# Patient Record
Sex: Female | Born: 1970 | Race: White | Hispanic: No | Marital: Married | Smoking: Former smoker
Health system: Southern US, Community
[De-identification: ages and names within clinical notes are randomized; demographics above are authoritative.]

## PROBLEM LIST (undated history)

## (undated) DIAGNOSIS — J45909 Unspecified asthma, uncomplicated: Secondary | ICD-10-CM

## (undated) DIAGNOSIS — R51 Headache: Secondary | ICD-10-CM

## (undated) DIAGNOSIS — K219 Gastro-esophageal reflux disease without esophagitis: Secondary | ICD-10-CM

## (undated) DIAGNOSIS — F419 Anxiety disorder, unspecified: Secondary | ICD-10-CM

## (undated) DIAGNOSIS — G5601 Carpal tunnel syndrome, right upper limb: Secondary | ICD-10-CM

## (undated) DIAGNOSIS — Z9889 Other specified postprocedural states: Secondary | ICD-10-CM

## (undated) DIAGNOSIS — R112 Nausea with vomiting, unspecified: Secondary | ICD-10-CM

## (undated) HISTORY — PX: BREAST SURGERY: SHX581

## (undated) HISTORY — PX: APPENDECTOMY: SHX54

## (undated) HISTORY — PX: BREAST LUMPECTOMY: SHX2

## (undated) HISTORY — PX: TONSILLECTOMY: SUR1361

---

## 2013-07-02 ENCOUNTER — Other Ambulatory Visit: Payer: Self-pay | Admitting: Orthopedic Surgery

## 2013-07-27 ENCOUNTER — Encounter (HOSPITAL_BASED_OUTPATIENT_CLINIC_OR_DEPARTMENT_OTHER): Payer: Self-pay | Admitting: *Deleted

## 2013-07-29 ENCOUNTER — Encounter (HOSPITAL_BASED_OUTPATIENT_CLINIC_OR_DEPARTMENT_OTHER): Payer: Self-pay | Admitting: *Deleted

## 2013-08-02 ENCOUNTER — Ambulatory Visit (HOSPITAL_BASED_OUTPATIENT_CLINIC_OR_DEPARTMENT_OTHER)
Admission: RE | Admit: 2013-08-02 | Discharge: 2013-08-02 | Disposition: A | Discharge status: Home / Self Care | Payer: 59 | Source: Ambulatory Visit | Attending: Orthopedic Surgery | Admitting: Orthopedic Surgery

## 2013-08-02 ENCOUNTER — Encounter (HOSPITAL_BASED_OUTPATIENT_CLINIC_OR_DEPARTMENT_OTHER)
Admission: RE | Disposition: A | Discharge status: Home / Self Care | Payer: Self-pay | Source: Ambulatory Visit | Attending: Orthopedic Surgery

## 2013-08-02 ENCOUNTER — Encounter (HOSPITAL_BASED_OUTPATIENT_CLINIC_OR_DEPARTMENT_OTHER): Payer: 59 | Admitting: Anesthesiology

## 2013-08-02 ENCOUNTER — Ambulatory Visit (HOSPITAL_BASED_OUTPATIENT_CLINIC_OR_DEPARTMENT_OTHER): Payer: 59 | Admitting: Anesthesiology

## 2013-08-02 ENCOUNTER — Encounter (HOSPITAL_BASED_OUTPATIENT_CLINIC_OR_DEPARTMENT_OTHER): Payer: Self-pay | Admitting: *Deleted

## 2013-08-02 DIAGNOSIS — J45909 Unspecified asthma, uncomplicated: Secondary | ICD-10-CM | POA: Insufficient documentation

## 2013-08-02 DIAGNOSIS — E119 Type 2 diabetes mellitus without complications: Secondary | ICD-10-CM | POA: Insufficient documentation

## 2013-08-02 DIAGNOSIS — M129 Arthropathy, unspecified: Secondary | ICD-10-CM | POA: Insufficient documentation

## 2013-08-02 DIAGNOSIS — K219 Gastro-esophageal reflux disease without esophagitis: Secondary | ICD-10-CM | POA: Insufficient documentation

## 2013-08-02 DIAGNOSIS — Z87891 Personal history of nicotine dependence: Secondary | ICD-10-CM | POA: Insufficient documentation

## 2013-08-02 DIAGNOSIS — F411 Generalized anxiety disorder: Secondary | ICD-10-CM | POA: Insufficient documentation

## 2013-08-02 DIAGNOSIS — Z880 Allergy status to penicillin: Secondary | ICD-10-CM | POA: Insufficient documentation

## 2013-08-02 DIAGNOSIS — G5600 Carpal tunnel syndrome, unspecified upper limb: Secondary | ICD-10-CM | POA: Insufficient documentation

## 2013-08-02 DIAGNOSIS — R51 Headache: Secondary | ICD-10-CM | POA: Insufficient documentation

## 2013-08-02 HISTORY — DX: Other specified postprocedural states: R11.2

## 2013-08-02 HISTORY — DX: Carpal tunnel syndrome, right upper limb: G56.01

## 2013-08-02 HISTORY — DX: Headache: R51

## 2013-08-02 HISTORY — DX: Gastro-esophageal reflux disease without esophagitis: K21.9

## 2013-08-02 HISTORY — DX: Other specified postprocedural states: Z98.890

## 2013-08-02 HISTORY — DX: Unspecified asthma, uncomplicated: J45.909

## 2013-08-02 HISTORY — DX: Anxiety disorder, unspecified: F41.9

## 2013-08-02 HISTORY — PX: CARPAL TUNNEL RELEASE: SHX101

## 2013-08-02 LAB — POCT HEMOGLOBIN-HEMACUE: Hemoglobin: 13.9 g/dL (ref 12.0–15.0)

## 2013-08-02 SURGERY — CARPAL TUNNEL RELEASE
Anesthesia: Monitor Anesthesia Care | Site: Hand | Laterality: Right

## 2013-08-02 MED ORDER — OXYCODONE HCL 5 MG PO TABS: 5.0000 mg | ORAL_TABLET | Freq: Once | ORAL | Status: DC | PRN

## 2013-08-02 MED ORDER — BUPIVACAINE HCL (PF) 0.25 % IJ SOLN
INTRAMUSCULAR | Status: AC
  Filled 2013-08-02: qty 30

## 2013-08-02 MED ORDER — ONDANSETRON HCL 4 MG/2ML IJ SOLN
INTRAMUSCULAR | Status: DC | PRN
  Administered 2013-08-02: 4 mg via INTRAVENOUS

## 2013-08-02 MED ORDER — BUPIVACAINE HCL (PF) 0.25 % IJ SOLN
INTRAMUSCULAR | Status: DC | PRN
  Administered 2013-08-02: 6 mL

## 2013-08-02 MED ORDER — MIDAZOLAM HCL 5 MG/5ML IJ SOLN
INTRAMUSCULAR | Status: DC | PRN
  Administered 2013-08-02: 2 mg via INTRAVENOUS

## 2013-08-02 MED ORDER — MIDAZOLAM HCL 2 MG/2ML IJ SOLN: 1.0000 mg | INTRAMUSCULAR | Status: DC | PRN

## 2013-08-02 MED ORDER — HYDROCODONE-ACETAMINOPHEN 5-325 MG PO TABS: 1.0000 | ORAL_TABLET | Freq: Four times a day (QID) | ORAL | Status: DC | PRN

## 2013-08-02 MED ORDER — OXYCODONE HCL 5 MG/5ML PO SOLN: 5.0000 mg | Freq: Once | ORAL | Status: DC | PRN

## 2013-08-02 MED ORDER — FENTANYL CITRATE 0.05 MG/ML IJ SOLN
INTRAMUSCULAR | Status: AC
  Filled 2013-08-02: qty 4

## 2013-08-02 MED ORDER — CHLORHEXIDINE GLUCONATE 4 % EX LIQD: 60.0000 mL | Freq: Once | CUTANEOUS | Status: DC

## 2013-08-02 MED ORDER — FENTANYL CITRATE 0.05 MG/ML IJ SOLN: 50.0000 ug | Freq: Once | INTRAMUSCULAR | Status: DC

## 2013-08-02 MED ORDER — MIDAZOLAM HCL 2 MG/2ML IJ SOLN
INTRAMUSCULAR | Status: AC
  Filled 2013-08-02: qty 2

## 2013-08-02 MED ORDER — PROMETHAZINE HCL 25 MG/ML IJ SOLN: 6.2500 mg | INTRAMUSCULAR | Status: DC | PRN

## 2013-08-02 MED ORDER — HYDROMORPHONE HCL PF 1 MG/ML IJ SOLN: 0.2500 mg | INTRAMUSCULAR | Status: DC | PRN

## 2013-08-02 MED ORDER — PROPOFOL INFUSION 10 MG/ML OPTIME
INTRAVENOUS | Status: DC | PRN
  Administered 2013-08-02: 100 ug/kg/min via INTRAVENOUS

## 2013-08-02 MED ORDER — FENTANYL CITRATE 0.05 MG/ML IJ SOLN: 50.0000 ug | INTRAMUSCULAR | Status: DC | PRN

## 2013-08-02 MED ORDER — LACTATED RINGERS IV SOLN
INTRAVENOUS | Status: DC
  Administered 2013-08-02: 13:00:00 via INTRAVENOUS

## 2013-08-02 MED ORDER — FENTANYL CITRATE 0.05 MG/ML IJ SOLN
INTRAMUSCULAR | Status: DC | PRN
  Administered 2013-08-02: 100 ug via INTRAVENOUS

## 2013-08-02 SURGICAL SUPPLY — 36 items
BLADE SURG 15 STRL LF DISP TIS (BLADE) ×1 IMPLANT
BLADE SURG 15 STRL SS (BLADE) ×1
BNDG COHESIVE 3X5 TAN STRL LF (GAUZE/BANDAGES/DRESSINGS) ×2 IMPLANT
BNDG ESMARK 4X9 LF (GAUZE/BANDAGES/DRESSINGS) IMPLANT
BNDG GAUZE ELAST 4 BULKY (GAUZE/BANDAGES/DRESSINGS) ×2 IMPLANT
CHLORAPREP W/TINT 26ML (MISCELLANEOUS) ×2 IMPLANT
CORDS BIPOLAR (ELECTRODE) ×2 IMPLANT
COVER MAYO STAND STRL (DRAPES) ×2 IMPLANT
COVER TABLE BACK 60X90 (DRAPES) ×2 IMPLANT
CUFF TOURNIQUET SINGLE 18IN (TOURNIQUET CUFF) ×2 IMPLANT
DRAPE EXTREMITY T 121X128X90 (DRAPE) ×2 IMPLANT
DRAPE SURG 17X23 STRL (DRAPES) ×2 IMPLANT
DRSG KUZMA FLUFF (GAUZE/BANDAGES/DRESSINGS) IMPLANT
GAUZE XEROFORM 1X8 LF (GAUZE/BANDAGES/DRESSINGS) ×2 IMPLANT
GLOVE BIO SURGEON STRL SZ 6.5 (GLOVE) ×2 IMPLANT
GLOVE BIOGEL PI IND STRL 8.5 (GLOVE) ×1 IMPLANT
GLOVE BIOGEL PI INDICATOR 8.5 (GLOVE) ×1
GLOVE SURG ORTHO 8.0 STRL STRW (GLOVE) ×2 IMPLANT
GOWN BRE IMP PREV XXLGXLNG (GOWN DISPOSABLE) ×2 IMPLANT
GOWN PREVENTION PLUS XLARGE (GOWN DISPOSABLE) ×2 IMPLANT
NEEDLE 27GAX1X1/2 (NEEDLE) ×2 IMPLANT
NS IRRIG 1000ML POUR BTL (IV SOLUTION) ×2 IMPLANT
PACK BASIN DAY SURGERY FS (CUSTOM PROCEDURE TRAY) ×2 IMPLANT
PAD ABD 8X10 STRL (GAUZE/BANDAGES/DRESSINGS) ×2 IMPLANT
PAD CAST 3X4 CTTN HI CHSV (CAST SUPPLIES) ×1 IMPLANT
PADDING CAST ABS 4INX4YD NS (CAST SUPPLIES) ×1
PADDING CAST ABS COTTON 4X4 ST (CAST SUPPLIES) ×1 IMPLANT
PADDING CAST COTTON 3X4 STRL (CAST SUPPLIES) ×1
SPONGE GAUZE 4X4 12PLY (GAUZE/BANDAGES/DRESSINGS) ×2 IMPLANT
STOCKINETTE 4X48 STRL (DRAPES) ×2 IMPLANT
SUT VICRYL 4-0 PS2 18IN ABS (SUTURE) IMPLANT
SUT VICRYL RAPIDE 4/0 PS 2 (SUTURE) ×2 IMPLANT
SYR BULB 3OZ (MISCELLANEOUS) ×2 IMPLANT
SYR CONTROL 10ML LL (SYRINGE) ×2 IMPLANT
TOWEL OR 17X24 6PK STRL BLUE (TOWEL DISPOSABLE) ×2 IMPLANT
UNDERPAD 30X30 INCONTINENT (UNDERPADS AND DIAPERS) ×2 IMPLANT

## 2013-08-02 NOTE — Anesthesia Postprocedure Evaluation (Signed)
  Anesthesia Post-op Note  Patient: Julie [name]  Procedure(s) Performed: Procedure(s): RIGHT CARPAL TUNNEL RELEASE (Right)  Patient Location: PACU  Anesthesia Type:MAC and Bier block  Level of Consciousness: awake and alert   Airway and Oxygen Therapy: Patient Spontanous Breathing  Post-op Pain: mild  Post-op Assessment: Post-op Vital signs reviewed, Patient's Cardiovascular Status Stable, Respiratory Function Stable, Patent Airway, No signs of Nausea or vomiting and Pain level controlled  Post-op Vital Signs: Reviewed and stable  Complications: No apparent anesthesia complications

## 2013-08-02 NOTE — Transfer of Care (Signed)
Immediate Anesthesia Transfer of Care Note  Patient: Julie [name]  Procedure(s) Performed: Procedure(s): RIGHT CARPAL TUNNEL RELEASE (Right)  Patient Location: PACU  Anesthesia Type:Bier block  Level of Consciousness: awake, alert  and oriented  Airway & Oxygen Therapy: Patient Spontanous Breathing and Patient connected to face mask oxygen  Post-op Assessment: Report given to PACU RN and Post -op Vital signs reviewed and stable  Post vital signs: Reviewed and stable  Complications: No apparent anesthesia complications

## 2013-08-02 NOTE — H&P (Signed)
Julie [name] is a 42 year old right hand dominant female who comes in complaining of numbness and tingling in her right hand, thumb through ring finger. She states she had bilateral hand pain and numbness during her pregnancies. She had a left carpal tunnel release done in 2012 by Dr. Merwyn Katos. Nerve conductions were done at that time and were positive and this gave her excellent relief of symptoms. She now has similar symptoms on her right side. She is complaining of numbness tingling and pain burning cold on her right hand with moderate severe aching type pain. She is awakened 7 out of 7 nights. 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 She has no history of diabetes, thyroid problems or gout. She has had injections to both sides. The carpal tunnel release on her left side has done very well. She has had her nerve conductions done revealing motor delay of 5.1 on the right, sensory delay of 2.6.   Past Medical History: she is allergic to PCN, Amoxicillin and Vancomycin. She is on Omeprazole. She has had tonsillectomy, adenoidectomy, breast augmentation, appendectomy, lumpectomy right breast, C-section and carpal tunnel release.   Family Medical History: Positive for diabetes and arthritis.  Social History: She does not smoke or drink. She is married and a Production assistant, radio at [employer].  Review of Systems: Positive for asthma, headaches, otherwise negative.  Julie [name] is an 42 y.o. female.   Chief Complaint: ctsrt HPI: see above  Past Medical History  Diagnosis Date  . Anxiety   . Asthma   . GERD (gastroesophageal reflux disease)   . Headache(784.0)   . Carpal tunnel syndrome of right wrist   . PONV (postoperative nausea and vomiting)     Past Surgical History  Procedure Laterality Date  . Tonsillectomy    . Appendectomy    . Cesarean section      x3  . Breast surgery      augmentation  . Breast lumpectomy Right     History reviewed. No pertinent family history. Social  History:  reports that she quit smoking about 21 years ago. She does not have any smokeless tobacco history on file. She reports that she does not use illicit drugs. Her alcohol history is not on file.  Allergies:  Allergies  Allergen Reactions  . Penicillins Hives  . Vancomycin Hives  . Amoxicillin Rash    Medications Prior to Admission  Medication Sig Dispense Refill  . ibuprofen (ADVIL,MOTRIN) 200 MG tablet Take 200 mg by mouth every 6 (six) hours as needed.      Marland Kitchen levonorgestrel (MIRENA) 20 MCG/24HR IUD 1 each by Intrauterine route once.      Marland Kitchen omeprazole (PRILOSEC) 40 MG capsule Take 40 mg by mouth daily.      Marland Kitchen albuterol (PROVENTIL HFA;VENTOLIN HFA) 108 (90 BASE) MCG/ACT inhaler Inhale into the lungs every 6 (six) hours as needed for wheezing or shortness of breath.      . SUMAtriptan (IMITREX) 50 MG tablet Take 50 mg by mouth every 2 (two) hours as needed for migraine or headache. May repeat in 2 hours if headache persists or recurs.        Results for orders placed during the hospital encounter of 08/02/13 (from the past 48 hour(s))  POCT HEMOGLOBIN-HEMACUE     Status: None   Collection Time    08/02/13 12:43 PM      Result Value Range   Hemoglobin 13.9  12.0 - 15.0 g/dL    No  results found.   Pertinent items are noted in HPI.  Blood pressure 123/80, pulse 80, temperature 98.1 F (36.7 C), temperature source Oral, resp. rate 18, height 5\' 3"  (1.6 m), weight 125 lb (56.7 kg), SpO2 100.00%.  General appearance: alert, cooperative and appears stated age Head: Normocephalic, without obvious abnormality Neck: no JVD Resp: clear to auscultation bilaterally Cardio: regular rate and rhythm, S1, S2 normal, no murmur, click, rub or gallop GI: soft, non-tender; bowel sounds normal; no masses,  no organomegaly Extremities: extremities normal, atraumatic, no cyanosis or edema Pulses: 2+ and symmetric Skin: Skin color, texture, turgor normal. No rashes or lesions Neurologic:  Grossly normal Incision/Wound: na  Assessment/Plan This has gotten worse. She would like to proceed to have this taken care of surgically. The pre, peri and post op course are discussed along with risks and complications.  She is aware there is no guarantee with surgery, possibility of infection, recurrence, injury to arteries, nerves and tendons, incomplete relief of symptoms and dystrophy.  She is scheduled for right carpal tunnel release as an outpatient under regional anesthesia. She will be at one handed work for 6 weeks unless she feels she can go back sooner than that. She is advised she can't get is wet for 2 weeks  Meribeth Vitug R 08/02/2013, 1:27 PM

## 2013-08-02 NOTE — Anesthesia Preprocedure Evaluation (Signed)
Anesthesia Evaluation  Patient identified by MRN, date of birth, ID band Patient awake    Reviewed: Allergy & Precautions, H&P , NPO status , Patient's Chart, lab work & pertinent test results  History of Anesthesia Complications (+) PONV  Airway Mallampati: I TM Distance: >3 FB Neck ROM: Full    Dental   Pulmonary asthma , former smoker,  breath sounds clear to auscultation        Cardiovascular Rhythm:Regular Rate:Normal     Neuro/Psych  Headaches, Anxiety    GI/Hepatic GERD-  ,  Endo/Other    Renal/GU      Musculoskeletal   Abdominal   Peds  Hematology   Anesthesia Other Findings   Reproductive/Obstetrics                           Anesthesia Physical Anesthesia Plan  ASA: II  Anesthesia Plan: MAC and Regional   Post-op Pain Management:    Induction: Intravenous  Airway Management Planned: Simple Face Mask  Additional Equipment:   Intra-op Plan:   Post-operative Plan:   Informed Consent: I have reviewed the patients History and Physical, chart, labs and discussed the procedure including the risks, benefits and alternatives for the proposed anesthesia with the patient or authorized representative who has indicated his/her understanding and acceptance.     Plan Discussed with: CRNA and Surgeon  Anesthesia Plan Comments:         Anesthesia Quick Evaluation

## 2013-08-02 NOTE — Brief Op Note (Signed)
08/02/2013  2:29 PM  PATIENT:  Julie [name]  42 y.o. female  PRE-OPERATIVE DIAGNOSIS:  RIGHT CARPAL TUNNEL SYNDROME  POST-OPERATIVE DIAGNOSIS:  RIGHT CARPAL TUNNEL SYNDROME  PROCEDURE:  Procedure(s): RIGHT CARPAL TUNNEL RELEASE (Right)  SURGEON:  Surgeon(s) and Role:    * Nicki Reaper, MD - Primary  PHYSICIAN ASSISTANT:   ASSISTANTS: none   ANESTHESIA:   local and regional  EBL:     BLOOD ADMINISTERED:none  DRAINS: none   LOCAL MEDICATIONS USED:  BUPIVICAINE   SPECIMEN:  No Specimen  DISPOSITION OF SPECIMEN:  none  COUNTS:  YES  TOURNIQUET:   Total Tourniquet Time Documented: Forearm (Right) - 21 minutes Total: Forearm (Right) - 21 minutes   DICTATION: .Other Dictation: Dictation Number [id number]  PLAN OF CARE: Discharge to home after PACU  PATIENT DISPOSITION:  PACU - hemodynamically stable.

## 2013-08-02 NOTE — Op Note (Signed)
Dictation Number [id number]

## 2013-08-03 ENCOUNTER — Encounter (HOSPITAL_BASED_OUTPATIENT_CLINIC_OR_DEPARTMENT_OTHER): Payer: Self-pay | Admitting: Orthopedic Surgery

## 2013-08-19 NOTE — Op Note (Signed)
redictation number ; [id number]

## 2013-08-20 NOTE — Op Note (Signed)
[name]:  [name], Julie [name]                ACCOUNT NO.:  [id number]  MEDICAL RECORD NO.:  [id number]  LOCATION:                                 FACILITY:  PHYSICIAN:  Cindee SaltGary Rihan Schueler, M.D.            DATE OF BIRTH:  DATE OF PROCEDURE:  08/02/2013 DATE OF DISCHARGE:                              OPERATIVE REPORT   PREOPERATIVE DIAGNOSIS:  Carpal tunnel syndrome, right hand.  POSTOPERATIVE DIAGNOSIS:  Carpal tunnel syndrome, right hand.  OPERATION:  Decompression, right median nerve.  SURGEON:  Cindee SaltGary Velvie Thomaston, M.D.  ANESTHESIA:  Forearm-based IV regional.  It is noted this is a re-dictation and if the original dictation is found, there may be significant differences and/or similarities.  HISTORY:  The patient is a 43 year old female with bilateral carpal tunnel syndrome.  She has undergone release on her left side, is admitted now for release to the right carpal canal.  Pre, peri, and postoperative course have been discussed along with risks and complications.  She is aware that there is no guarantee with the surgery, possibility of infection; recurrence of injury to arteries, nerves, tendons, incomplete relief of symptoms, and dystrophy.  In preoperative area, the patient is seen, the extremity marked by both the patient and surgeon.  DESCRIPTION OF PROCEDURE:  The patient was brought to the operating room, where a regional anesthetic was carried out without difficulty. She was prepped using ChloraPrep, supine position with the right arm free.  A 3-minute dry time was allowed.  Time-out taken, confirming the patient and procedure.  A longitudinal incision was made in the right palm, carried down through subcutaneous tissue.  Bleeders were electrocauterized.  Palmar fascia was split.  Superficial palmar arch identified.  The flexor tendon to the ring and little finger identified to the ulnar side of median nerve.  The carpal retinaculum was incised with sharp dissection.  Right angle and  Sewall retractor was placed between skin and forearm fascia.  The fascia released for approximately 1.5 cm proximal to the wrist crease under direct vision.  The canal was explored.  No further lesions were identified.  Air compression to the nerve was apparent.  The wound was irrigated.  The skin then closed with interrupted 4-0 Vicryl Rapide sutures. Sterile compressive dressing with fingers free was applied.  On deflation of the tourniquet, all fingers immediately pinked.  She was taken to the recovery room for observation in a satisfactory condition. She will be discharged home to [facility] [place] 1 [place].          ______________________________ Cindee SaltGary Diesha Rostad, M.D.     GK/MEDQ  D:  08/19/2013  T:  08/20/2013  Job:  [id number]

## 2015-04-26 ENCOUNTER — Other Ambulatory Visit: Payer: Self-pay | Admitting: Orthopedic Surgery

## 2015-07-31 ENCOUNTER — Encounter (HOSPITAL_BASED_OUTPATIENT_CLINIC_OR_DEPARTMENT_OTHER): Payer: Self-pay | Admitting: *Deleted

## 2015-08-10 ENCOUNTER — Ambulatory Visit (HOSPITAL_BASED_OUTPATIENT_CLINIC_OR_DEPARTMENT_OTHER): Payer: Commercial Managed Care - HMO | Admitting: Certified Registered"

## 2015-08-10 ENCOUNTER — Encounter (HOSPITAL_BASED_OUTPATIENT_CLINIC_OR_DEPARTMENT_OTHER)
Admission: RE | Disposition: A | Discharge status: Home / Self Care | Payer: Self-pay | Source: Ambulatory Visit | Attending: Orthopedic Surgery

## 2015-08-10 ENCOUNTER — Encounter (HOSPITAL_BASED_OUTPATIENT_CLINIC_OR_DEPARTMENT_OTHER): Payer: Self-pay | Admitting: Certified Registered"

## 2015-08-10 ENCOUNTER — Ambulatory Visit (HOSPITAL_BASED_OUTPATIENT_CLINIC_OR_DEPARTMENT_OTHER)
Admission: RE | Admit: 2015-08-10 | Discharge: 2015-08-10 | Disposition: A | Discharge status: Home / Self Care | Payer: Commercial Managed Care - HMO | Source: Ambulatory Visit | Attending: Orthopedic Surgery | Admitting: Orthopedic Surgery

## 2015-08-10 DIAGNOSIS — Z87891 Personal history of nicotine dependence: Secondary | ICD-10-CM | POA: Diagnosis not present

## 2015-08-10 DIAGNOSIS — J45909 Unspecified asthma, uncomplicated: Secondary | ICD-10-CM | POA: Diagnosis not present

## 2015-08-10 DIAGNOSIS — M2021 Hallux rigidus, right foot: Secondary | ICD-10-CM | POA: Diagnosis present

## 2015-08-10 DIAGNOSIS — K219 Gastro-esophageal reflux disease without esophagitis: Secondary | ICD-10-CM | POA: Insufficient documentation

## 2015-08-10 DIAGNOSIS — M25571 Pain in right ankle and joints of right foot: Secondary | ICD-10-CM

## 2015-08-10 DIAGNOSIS — Z880 Allergy status to penicillin: Secondary | ICD-10-CM | POA: Insufficient documentation

## 2015-08-10 DIAGNOSIS — F419 Anxiety disorder, unspecified: Secondary | ICD-10-CM | POA: Diagnosis not present

## 2015-08-10 HISTORY — PX: CHEILECTOMY: SHX1336

## 2015-08-10 SURGERY — CHEILECTOMY
Anesthesia: Monitor Anesthesia Care | Site: Foot | Laterality: Right

## 2015-08-10 MED ORDER — FENTANYL CITRATE (PF) 100 MCG/2ML IJ SOLN
50.0000 ug | INTRAMUSCULAR | Status: DC | PRN
  Administered 2015-08-10: 100 ug via INTRAVENOUS
  Administered 2015-08-10: 25 ug via INTRAVENOUS

## 2015-08-10 MED ORDER — 0.9 % SODIUM CHLORIDE (POUR BTL) OPTIME
TOPICAL | Status: DC | PRN
  Administered 2015-08-10: 500 mL

## 2015-08-10 MED ORDER — SENNA 8.6 MG PO TABS: 2.0000 | ORAL_TABLET | Freq: Two times a day (BID) | ORAL | Status: DC

## 2015-08-10 MED ORDER — BUPIVACAINE-EPINEPHRINE (PF) 0.5% -1:200000 IJ SOLN
INTRAMUSCULAR | Status: AC
  Filled 2015-08-10: qty 30

## 2015-08-10 MED ORDER — OXYCODONE HCL 5 MG/5ML PO SOLN: 5.0000 mg | Freq: Once | ORAL | Status: DC | PRN

## 2015-08-10 MED ORDER — CLINDAMYCIN PHOSPHATE 900 MG/50ML IV SOLN
INTRAVENOUS | Status: AC
  Filled 2015-08-10: qty 50

## 2015-08-10 MED ORDER — HYDROMORPHONE HCL 1 MG/ML IJ SOLN
INTRAMUSCULAR | Status: AC
  Filled 2015-08-10: qty 1

## 2015-08-10 MED ORDER — FENTANYL CITRATE (PF) 100 MCG/2ML IJ SOLN
INTRAMUSCULAR | Status: AC
  Filled 2015-08-10: qty 2

## 2015-08-10 MED ORDER — PROPOFOL 10 MG/ML IV BOLUS
INTRAVENOUS | Status: AC
  Filled 2015-08-10: qty 20

## 2015-08-10 MED ORDER — OXYCODONE HCL 5 MG PO TABS
ORAL_TABLET | ORAL | Status: AC
  Filled 2015-08-10: qty 1

## 2015-08-10 MED ORDER — LACTATED RINGERS IV SOLN
INTRAVENOUS | Status: DC
  Administered 2015-08-10: 07:00:00 via INTRAVENOUS
  Administered 2015-08-10: 10 mL/h via INTRAVENOUS
  Administered 2015-08-10: 09:00:00 via INTRAVENOUS

## 2015-08-10 MED ORDER — LIDOCAINE HCL (CARDIAC) 20 MG/ML IV SOLN
INTRAVENOUS | Status: DC | PRN
  Administered 2015-08-10: 30 mg via INTRAVENOUS

## 2015-08-10 MED ORDER — PROPOFOL 10 MG/ML IV BOLUS
INTRAVENOUS | Status: DC | PRN
  Administered 2015-08-10: 200 mg via INTRAVENOUS

## 2015-08-10 MED ORDER — ONDANSETRON HCL 4 MG/2ML IJ SOLN
INTRAMUSCULAR | Status: AC
  Filled 2015-08-10: qty 2

## 2015-08-10 MED ORDER — BUPIVACAINE-EPINEPHRINE 0.5% -1:200000 IJ SOLN
INTRAMUSCULAR | Status: DC | PRN
  Administered 2015-08-10: 10 mL

## 2015-08-10 MED ORDER — DOCUSATE SODIUM 100 MG PO CAPS: 100.0000 mg | ORAL_CAPSULE | Freq: Two times a day (BID) | ORAL | Status: DC

## 2015-08-10 MED ORDER — MIDAZOLAM HCL 2 MG/2ML IJ SOLN
INTRAMUSCULAR | Status: AC
  Filled 2015-08-10: qty 2

## 2015-08-10 MED ORDER — OXYCODONE HCL 5 MG PO TABS: 5.0000 mg | ORAL_TABLET | ORAL | Status: DC | PRN

## 2015-08-10 MED ORDER — DEXAMETHASONE SODIUM PHOSPHATE 10 MG/ML IJ SOLN
INTRAMUSCULAR | Status: DC | PRN
  Administered 2015-08-10: 10 mg via INTRAVENOUS

## 2015-08-10 MED ORDER — MIDAZOLAM HCL 2 MG/2ML IJ SOLN
1.0000 mg | INTRAMUSCULAR | Status: DC | PRN
  Administered 2015-08-10: 2 mg via INTRAVENOUS
  Administered 2015-08-10: 1 mg via INTRAVENOUS

## 2015-08-10 MED ORDER — DEXAMETHASONE SODIUM PHOSPHATE 10 MG/ML IJ SOLN
INTRAMUSCULAR | Status: AC
  Filled 2015-08-10: qty 1

## 2015-08-10 MED ORDER — LIDOCAINE HCL (CARDIAC) 20 MG/ML IV SOLN
INTRAVENOUS | Status: AC
  Filled 2015-08-10: qty 5

## 2015-08-10 MED ORDER — BUPIVACAINE-EPINEPHRINE (PF) 0.5% -1:200000 IJ SOLN
INTRAMUSCULAR | Status: DC | PRN
  Administered 2015-08-10: 15 mL via PERINEURAL

## 2015-08-10 MED ORDER — SCOPOLAMINE 1 MG/3DAYS TD PT72
MEDICATED_PATCH | TRANSDERMAL | Status: AC
  Filled 2015-08-10: qty 1

## 2015-08-10 MED ORDER — CHLORHEXIDINE GLUCONATE 4 % EX LIQD: 60.0000 mL | Freq: Once | CUTANEOUS | Status: DC

## 2015-08-10 MED ORDER — MEPERIDINE HCL 25 MG/ML IJ SOLN: 6.2500 mg | INTRAMUSCULAR | Status: DC | PRN

## 2015-08-10 MED ORDER — OXYCODONE HCL 5 MG PO TABS: 5.0000 mg | ORAL_TABLET | Freq: Once | ORAL | Status: DC | PRN

## 2015-08-10 MED ORDER — SODIUM CHLORIDE 0.9 % IV SOLN: INTRAVENOUS | Status: DC

## 2015-08-10 MED ORDER — GLYCOPYRROLATE 0.2 MG/ML IJ SOLN: 0.2000 mg | Freq: Once | INTRAMUSCULAR | Status: DC | PRN

## 2015-08-10 MED ORDER — CLINDAMYCIN PHOSPHATE 900 MG/50ML IV SOLN
900.0000 mg | INTRAVENOUS | Status: AC
  Administered 2015-08-10: 900 mg via INTRAVENOUS

## 2015-08-10 MED ORDER — HYDROMORPHONE HCL 1 MG/ML IJ SOLN
0.2500 mg | INTRAMUSCULAR | Status: DC | PRN
  Administered 2015-08-10: 0.5 mg via INTRAVENOUS

## 2015-08-10 MED ORDER — SCOPOLAMINE 1 MG/3DAYS TD PT72
1.0000 | MEDICATED_PATCH | Freq: Once | TRANSDERMAL | Status: DC | PRN
  Administered 2015-08-10: 1.5 mg via TRANSDERMAL

## 2015-08-10 SURGICAL SUPPLY — 74 items
BANDAGE ESMARK 6X9 LF (GAUZE/BANDAGES/DRESSINGS) ×2 IMPLANT
BLADE AVERAGE 25MMX9MM (BLADE) ×1
BLADE AVERAGE 25X9 (BLADE) ×3 IMPLANT
BLADE MICRO SAGITTAL (BLADE) IMPLANT
BLADE MINI RND TIP GREEN BEAV (BLADE) IMPLANT
BLADE OSC/SAG .038X5.5 CUT EDG (BLADE) IMPLANT
BLADE SURG 15 STRL LF DISP TIS (BLADE) ×4 IMPLANT
BLADE SURG 15 STRL SS (BLADE) ×4
BNDG COHESIVE 4X5 TAN STRL (GAUZE/BANDAGES/DRESSINGS) ×4 IMPLANT
BNDG COHESIVE 6X5 TAN STRL LF (GAUZE/BANDAGES/DRESSINGS) IMPLANT
BNDG CONFORM 2 STRL LF (GAUZE/BANDAGES/DRESSINGS) IMPLANT
BNDG CONFORM 3 STRL LF (GAUZE/BANDAGES/DRESSINGS) ×4 IMPLANT
BNDG ESMARK 6X9 LF (GAUZE/BANDAGES/DRESSINGS) ×4
CHLORAPREP W/TINT 26ML (MISCELLANEOUS) ×4 IMPLANT
COVER BACK TABLE 60X90IN (DRAPES) ×4 IMPLANT
CUFF TOURNIQUET SINGLE 18IN (TOURNIQUET CUFF) ×4 IMPLANT
CUFF TOURNIQUET SINGLE 24IN (TOURNIQUET CUFF) IMPLANT
CUFF TOURNIQUET SINGLE 34IN LL (TOURNIQUET CUFF) IMPLANT
DRAPE EXTREMITY T 121X128X90 (DRAPE) ×4 IMPLANT
DRAPE OEC MINIVIEW 54X84 (DRAPES) ×4 IMPLANT
DRAPE SURG 17X23 STRL (DRAPES) IMPLANT
DRAPE U-SHAPE 47X51 STRL (DRAPES) ×4 IMPLANT
DRSG MEPITEL 4X7.2 (GAUZE/BANDAGES/DRESSINGS) ×4 IMPLANT
DRSG PAD ABDOMINAL 8X10 ST (GAUZE/BANDAGES/DRESSINGS) IMPLANT
ELECT REM PT RETURN 9FT ADLT (ELECTROSURGICAL) ×4
ELECTRODE REM PT RTRN 9FT ADLT (ELECTROSURGICAL) ×2 IMPLANT
GAUZE SPONGE 4X4 12PLY STRL (GAUZE/BANDAGES/DRESSINGS) ×4 IMPLANT
GLOVE BIO SURGEON STRL SZ8 (GLOVE) ×4 IMPLANT
GLOVE BIOGEL PI IND STRL 7.0 (GLOVE) ×4 IMPLANT
GLOVE BIOGEL PI IND STRL 8 (GLOVE) ×4 IMPLANT
GLOVE BIOGEL PI INDICATOR 7.0 (GLOVE) ×4
GLOVE BIOGEL PI INDICATOR 8 (GLOVE) ×4
GLOVE ECLIPSE 6.5 STRL STRAW (GLOVE) ×8 IMPLANT
GLOVE ECLIPSE 7.0 STRL STRAW (GLOVE) ×4 IMPLANT
GLOVE ECLIPSE 7.5 STRL STRAW (GLOVE) ×4 IMPLANT
GLOVE EXAM NITRILE MD LF STRL (GLOVE) IMPLANT
GOWN STRL REUS W/ TWL LRG LVL3 (GOWN DISPOSABLE) ×2 IMPLANT
GOWN STRL REUS W/ TWL XL LVL3 (GOWN DISPOSABLE) ×4 IMPLANT
GOWN STRL REUS W/TWL LRG LVL3 (GOWN DISPOSABLE) ×2
GOWN STRL REUS W/TWL XL LVL3 (GOWN DISPOSABLE) ×4
GUIDEWIRE .08 (WIRE) IMPLANT
K-WIRE DBL END .054 LG (WIRE) ×4 IMPLANT
NEEDLE HYPO 22GX1.5 SAFETY (NEEDLE) IMPLANT
NEEDLE HYPO 25X1 1.5 SAFETY (NEEDLE) ×4 IMPLANT
NS IRRIG 1000ML POUR BTL (IV SOLUTION) ×4 IMPLANT
PACK BASIN DAY SURGERY FS (CUSTOM PROCEDURE TRAY) ×4 IMPLANT
PAD CAST 4YDX4 CTTN HI CHSV (CAST SUPPLIES) ×2 IMPLANT
PADDING CAST ABS 4INX4YD NS (CAST SUPPLIES)
PADDING CAST ABS COTTON 4X4 ST (CAST SUPPLIES) IMPLANT
PADDING CAST COTTON 4X4 STRL (CAST SUPPLIES) ×2
PADDING CAST COTTON 6X4 STRL (CAST SUPPLIES) IMPLANT
PENCIL BUTTON HOLSTER BLD 10FT (ELECTRODE) ×4 IMPLANT
SANITIZER HAND PURELL 535ML FO (MISCELLANEOUS) ×4 IMPLANT
SHEET MEDIUM DRAPE 40X70 STRL (DRAPES) ×4 IMPLANT
SLEEVE SCD COMPRESS KNEE MED (MISCELLANEOUS) ×4 IMPLANT
SPLINT FAST PLASTER 5X30 (CAST SUPPLIES)
SPLINT PLASTER CAST FAST 5X30 (CAST SUPPLIES) IMPLANT
SPONGE LAP 18X18 X RAY DECT (DISPOSABLE) ×4 IMPLANT
STOCKINETTE 6  STRL (DRAPES) ×2
STOCKINETTE 6 STRL (DRAPES) ×2 IMPLANT
SUCTION FRAZIER HANDLE 10FR (MISCELLANEOUS)
SUCTION TUBE FRAZIER 10FR DISP (MISCELLANEOUS) IMPLANT
SUT ETHILON 3 0 PS 1 (SUTURE) ×4 IMPLANT
SUT MNCRL AB 3-0 PS2 18 (SUTURE) ×4 IMPLANT
SUT VIC AB 0 SH 27 (SUTURE) IMPLANT
SUT VIC AB 2-0 SH 27 (SUTURE) ×2
SUT VIC AB 2-0 SH 27XBRD (SUTURE) ×2 IMPLANT
SYR BULB 3OZ (MISCELLANEOUS) ×4 IMPLANT
SYR CONTROL 10ML LL (SYRINGE) IMPLANT
TOWEL OR 17X24 6PK STRL BLUE (TOWEL DISPOSABLE) ×8 IMPLANT
TUBE CONNECTING 20'X1/4 (TUBING)
TUBE CONNECTING 20X1/4 (TUBING) IMPLANT
UNDERPAD 30X30 (UNDERPADS AND DIAPERS) ×4 IMPLANT
YANKAUER SUCT BULB TIP NO VENT (SUCTIONS) IMPLANT

## 2015-08-10 NOTE — Anesthesia Preprocedure Evaluation (Addendum)
Anesthesia Evaluation  Patient identified by MRN, date of birth, ID band Patient awake    Reviewed: Allergy & Precautions, NPO status , Patient's Chart, lab work & pertinent test results  History of Anesthesia Complications (+) PONV and history of anesthetic complications  Airway Mallampati: I  TM Distance: >3 FB Neck ROM: Full    Dental  (+) Teeth Intact, Dental Advisory Given   Pulmonary asthma , former smoker,    breath sounds clear to auscultation       Cardiovascular  Rhythm:Regular Rate:Normal     Neuro/Psych  Headaches, PSYCHIATRIC DISORDERS Anxiety    GI/Hepatic GERD  Medicated and Controlled,  Endo/Other    Renal/GU      Musculoskeletal   Abdominal   Peds  Hematology   Anesthesia Other Findings   Reproductive/Obstetrics                            Anesthesia Physical Anesthesia Plan  ASA: II  Anesthesia Plan: MAC   Post-op Pain Management:    Induction: Intravenous  Airway Management Planned: Simple Face Mask  Additional Equipment:   Intra-op Plan:   Post-operative Plan:   Informed Consent: I have reviewed the patients History and Physical, chart, labs and discussed the procedure including the risks, benefits and alternatives for the proposed anesthesia with the patient or authorized representative who has indicated his/her understanding and acceptance.   Dental advisory given  Plan Discussed with: CRNA, Anesthesiologist and Surgeon  Anesthesia Plan Comments:         Anesthesia Quick Evaluation

## 2015-08-10 NOTE — Anesthesia Postprocedure Evaluation (Signed)
Anesthesia Post Note  Patient: Julie [name]  Procedure(s) Performed: Procedure(s) (LRB): RIGHT HALLUX METATARSALPHALANGEAL JOINT  CHEILECTOMY (Right)  Patient location during evaluation: PACU Anesthesia Type: General Level of consciousness: awake and alert Pain management: pain level controlled Vital Signs Assessment: post-procedure vital signs reviewed and stable Respiratory status: spontaneous breathing, nonlabored ventilation and respiratory function stable Cardiovascular status: blood pressure returned to baseline and stable Postop Assessment: no signs of nausea or vomiting Anesthetic complications: no    Last Vitals:  Filed Vitals:   08/10/15 0845 08/10/15 0900  BP: 94/60 93/59  Pulse: 70 59  Temp:    Resp: 13 10    Last Pain:  Filed Vitals:   08/10/15 0941  PainSc: 5                  Rheya Minogue A

## 2015-08-10 NOTE — Op Note (Signed)
NAMECORLETTE, CIANO NO.:  [id number]  MEDICAL RECORD NO.:  [id number]  LOCATION:                                 FACILITY:  PHYSICIAN:  Toni Arthurs, MD             DATE OF BIRTH:  DATE OF PROCEDURE:  08/10/2015 DATE OF DISCHARGE:                              OPERATIVE REPORT   PREOPERATIVE DIAGNOSIS:  Right hallux rigidus.  POSTOPERATIVE DIAGNOSIS:  Right hallux rigidus.  PROCEDURE:  Right hallux metatarsophalangeal joint cheilectomy.  SURGEON:  Toni Arthurs, MD.  ASSISTANT:  Alfredo Martinez, PA-C.  ANESTHESIA:  General, regional.  ESTIMATED BLOOD LOSS:  Minimal.  TOURNIQUET TIME:  18 minutes at 150 mmHg.  COMPLICATIONS:  None apparent.  DISPOSITION:  Extubated, awake, and stable to recovery.  INDICATIONS FOR PROCEDURE:  The patient is a 44 year old female who has a painful right forefoot.  Signs and symptoms are consistent with hallux rigidus.  She presents now for cheilectomy.  She understands the risks and benefits, the alternative treatment options, and elects surgical treatment.  She specifically understands risks of bleeding, infection, nerve damage, blood clots, need for additional surgery, continued pain, progression of her arthritis, amputation, and death.  PROCEDURE IN DETAIL:  After preoperative consent was obtained and the correct operative site was identified, the patient was brought to the operating room and placed supine on the operating table.  General anesthesia was induced.  Preoperative antibiotics were administered. Surgical time-out was taken.  Right lower extremity was prepped and draped in standard sterile fashion with tourniquet around the calf.  The extremity was exsanguinated and tourniquet was inflated to 150 mmHg.  A longitudinal incision was made over the hallux MP joint.  Sharp dissection was carried down through skin and subcutaneous tissues.  The extensor hallucis longus and brevis tendons were protected and  an arthrotomy was made.  The joint capsule was elevated medially and laterally exposing the joint.  Osteophytes from the dorsal aspect of the proximal phalanx were resected with a rongeur.  An oscillating saw was used to remove the large dorsal osteophyte from the metatarsal head. There was full-thickness cartilage loss noted over the dorsal lateral 25% of the joint surface.  Most of this area was resected with the cheilectomy.  The remaining exposed subchondral bone was perforated with a 0.054 K-wire in multiple locations.  The collateral ligaments were released and the sesamoid articulations were mobilized with a Joker elevator.  Wound was irrigated copiously.  The joint capsule was repaired with simple sutures of 2-0 Vicryl, subcutaneous tissues were approximated with inverted simple sutures of 3-0 Monocryl, skin was closed with horizontal mattress sutures of 3-0 nylon.  Sterile dressings were applied followed by compression wrap.  Tourniquet was released at 18 minutes.  The patient was awakened from anesthesia and transported to the recovery room in stable condition.  FOLLOWUP PLAN:  The patient will be weightbearing as tolerated on the right lower extremity in a flat postop shoe.  She will follow up with me in 2 weeks for suture removal.  Alfredo Martinez, PA-C, was present and scrubbed for the duration of the case.  His assistance was  essential in positioning the patient, prepping and draping, gaining and maintaining exposure, performing the operation, closing and dressing the wounds, and applying compression wrap.     Toni ArthursJohn Jaycelynn Knickerbocker, MD     JH/MEDQ  D:  08/10/2015  T:  08/10/2015  Job:  [id number]

## 2015-08-10 NOTE — Discharge Instructions (Addendum)
Julie Arthurs, MD [facility]  Please read the following information regarding your care after surgery.  Medications  You only need a prescription for the narcotic pain medicine (ex. oxycodone, Percocet, Norco).  All of the other medicines listed below are available over the counter. X acetominophen (Tylenol) 650 mg every 4-6 hours as you need for minor pain X oxycodone as prescribed for moderate to severe pain ?   Narcotic pain medicine (ex. oxycodone, Percocet, Vicodin) will cause constipation.  To prevent this problem, take the following medicines while you are taking any pain medicine. X docusate sodium (Colace) 100 mg twice a day X senna (Senokot) 2 tablets twice a day    Weight Bearing     Regional Anesthesia Blocks  1. Numbness or the inability to move the "blocked" extremity may last from 3-48 hours after placement. The length of time depends on the medication injected and your individual response to the medication. If the numbness is not going away after 48 hours, call your surgeon.  2. The extremity that is blocked will need to be protected until the numbness is gone and the  Strength has returned. Because you cannot feel it, you will need to take extra care to avoid injury. Because it may be weak, you may have difficulty moving it or using it. You may not know what position it is in without looking at it while the block is in effect.  3. For blocks in the legs and feet, returning to weight bearing and walking needs to be done carefully. You will need to wait until the numbness is entirely gone and the strength has returned. You should be able to move your leg and foot normally before you try and bear weight or walk. You will need someone to be with you when you first try to ensure you do not fall and possibly risk injury.  4. Bruising and tenderness at the needle site are common side effects and will resolve in a few days.  5. Persistent numbness or new problems  with movement should be communicated to the surgeon or the [facility] [phone] [facility] [phone]).Regional Anesthesia Blocks  1. Numbness or the inability to move the "blocked" extremity may last from 3-48 hours after placement. The length of time depends on the medication injected and your individual response to the medication. If the numbness is not going away after 48 hours, call your surgeon.  2. The extremity that is blocked will need to be protected until the numbness is gone and the  Strength has returned. Because you cannot feel it, you will need to take extra care to avoid injury. Because it may be weak, you may have difficulty moving it or using it. You may not know what position it is in without looking at it while the block is in effect.  3. For blocks in the legs and feet, returning to weight bearing and walking needs to be done carefully. You will need to wait until the numbness is entirely gone and the strength has returned. You should be able to move your leg and foot normally before you try and bear weight or walk. You will need someone to be with you when you first try to ensure you do not fall and possibly risk injury.  4. Bruising and tenderness at the needle site are common side effects and will resolve in a few days.  5. Persistent numbness or new problems with movement should be communicated to  the surgeon or the [facility] [phone] [facility] [phone]).Regional Anesthesia Blocks  1. Numbness or the inability to move the "blocked" extremity may last from 3-48 hours after placement. The length of time depends on the medication injected and your individual response to the medication. If the numbness is not going away after 48 hours, call your surgeon.  2. The extremity that is blocked will need to be protected until the numbness is gone and the  Strength has returned. Because you cannot feel it, you  will need to take extra care to avoid injury. Because it may be weak, you may have difficulty moving it or using it. You may not know what position it is in without looking at it while the block is in effect.  3. For blocks in the legs and feet, returning to weight bearing and walking needs to be done carefully. You will need to wait until the numbness is entirely gone and the strength has returned. You should be able to move your leg and foot normally before you try and bear weight or walk. You will need someone to be with you when you first try to ensure you do not fall and possibly risk injury.  4. Bruising and tenderness at the needle site are common side effects and will resolve in a few days.  5. Persistent numbness or new problems with movement should be communicated to the surgeon or the [facility] [phone] [facility] [phone]). X Bear weight when you are able on your operated leg or foot in flat post-op shoe.  Cast / Splint / Dressing X Keep your splint or cast clean and dry.  Dont put anything (coat hanger, pencil, etc) down inside of it.  If it gets damp, use a hair dryer on the cool setting  to dry it.  If it gets soaked, call the office to schedule an appointment for a cast change.  After your dressing, cast or splint is removed; you may shower, but do not soak or scrub the wound.  Allow the water to run over it, and then gently pat it dry.  Swelling It is normal for you to have swelling where you had surgery.  To reduce swelling and pain, keep your toes above your nose for at least 3 days after surgery.  It may be necessary to keep your foot or leg elevated for several weeks.  If it hurts, it should be elevated.  Follow Up Call my office at [phone] when you are discharged from the hospital or surgery center to schedule an appointment to be seen two weeks after surgery.  Call my office at [phone] if you develop a fever >101.5 F,  nausea, vomiting, bleeding from the surgical site or severe pain.    Regional Anesthesia Blocks  1. Numbness or the inability to move the "blocked" extremity may last from 3-48 hours after placement. The length of time depends on the medication injected and your individual response to the medication. If the numbness is not going away after 48 hours, call your surgeon.  2. The extremity that is blocked will need to be protected until the numbness is gone and the  Strength has returned. Because you cannot feel it, you will need to take extra care to avoid injury. Because it may be weak, you may have difficulty moving it or using it. You may not know what position it is in without looking at it while the block  is in effect.  3. For blocks in the legs and feet, returning to weight bearing and walking needs to be done carefully. You will need to wait until the numbness is entirely gone and the strength has returned. You should be able to move your leg and foot normally before you try and bear weight or walk. You will need someone to be with you when you first try to ensure you do not fall and possibly risk injury.  4. Bruising and tenderness at the needle site are common side effects and will resolve in a few days.  5. Persistent numbness or new problems with movement should be communicated to the surgeon or the [facility] [phone])/ [facility] [place] [phone]).

## 2015-08-10 NOTE — Brief Op Note (Signed)
08/10/2015  8:30 AM  PATIENT:  Julie [name]  44 y.o. female  PRE-OPERATIVE DIAGNOSIS:  RIGHT HALLUX RIGIDUS   POST-OPERATIVE DIAGNOSIS:  RIGHT HALLUX RIGIDUS   Procedure(s): RIGHT HALLUX METATARSALPHALANGEAL JOINT  CHEILECTOMY  SURGEON:  Toni ArthursJohn Orva Gwaltney, MD  ASSISTANT: Alfredo MartinezJustin Ollis, PA-C  ANESTHESIA:   General, regional  EBL:  minimal   TOURNIQUET:   Total Tourniquet Time Documented: Calf (Right) - 18 minutes Total: Calf (Right) - 18 minutes  COMPLICATIONS:  None apparent  DISPOSITION:  Extubated, awake and stable to recovery.  DICTATION ID:  [id number]

## 2015-08-10 NOTE — Anesthesia Procedure Notes (Addendum)
Anesthesia Regional Block:  Adductor canal block  Pre-Anesthetic Checklist: ,, timeout performed, Correct Patient, Correct Site, Correct Laterality, Correct Procedure, Correct Position, site marked, Risks and benefits discussed,  Surgical consent,  Pre-op evaluation,  At surgeon's request and post-op pain management  Laterality: Right and Lower  Prep: chloraprep       Needles:  Injection technique: Single-shot  Needle Type: Echogenic Needle     Needle Length: 9cm 9 cm Needle Gauge: 21 and 21 G    Additional Needles:  Procedures: ultrasound guided (picture in chart) Adductor canal block Narrative:  Start time: 08/10/2015 7:06 AM End time: 08/10/2015 7:10 AM Injection made incrementally with aspirations every 5 mL.  Performed by: Personally  Anesthesiologist: CREWS, DAVID   Procedure Name: LMA Insertion Date/Time: 08/10/2015 7:39 AM Performed by: Aryan Bello D Pre-anesthesia Checklist: Patient identified, Emergency Drugs available, Suction available and Patient being monitored Patient Re-evaluated:Patient Re-evaluated prior to inductionOxygen Delivery Method: Circle System Utilized Preoxygenation: Pre-oxygenation with 100% oxygen Intubation Type: IV induction Ventilation: Mask ventilation without difficulty LMA: LMA inserted LMA Size: 4.0 Number of attempts: 1 Airway Equipment and Method: Bite block Placement Confirmation: positive ETCO2 Tube secured with: Tape Dental Injury: Teeth and Oropharynx as per pre-operative assessment       Right saphenous block image

## 2015-08-10 NOTE — H&P (Signed)
Julie [name] is an 44 y.o. female.   Chief Complaint: right forefoot pain HPI: 44 y/o female with right hallux rigidus c/o pain in the right forefoot with activity.  She has failed non op treatment and presents now for cheilectomy.  Past Medical History  Diagnosis Date  . Anxiety   . Asthma   . GERD (gastroesophageal reflux disease)   . Headache(784.0)   . Carpal tunnel syndrome of right wrist   . PONV (postoperative nausea and vomiting)     Past Surgical History  Procedure Laterality Date  . Tonsillectomy    . Appendectomy    . Cesarean section      x3  . Breast surgery      augmentation  . Breast lumpectomy Right   . Carpal tunnel release Right 08/02/2013    Procedure: RIGHT CARPAL TUNNEL RELEASE;  Surgeon: Nicki ReaperGary R Kuzma, MD;  Location: [facility];  Service: Orthopedics;  Laterality: Right;    History reviewed. No pertinent family history. Social History:  reports that she quit smoking about 23 years ago. She does not have any smokeless tobacco history on file. She reports that she does not drink alcohol or use illicit drugs.  Allergies:  Allergies  Allergen Reactions  . Penicillins Hives  . Vancomycin Hives  . Amoxicillin Rash    Medications Prior to Admission  Medication Sig Dispense Refill  . ibuprofen (ADVIL,MOTRIN) 200 MG tablet Take 200 mg by mouth every 6 (six) hours as needed.    Marland Kitchen. levonorgestrel (MIRENA) 20 MCG/24HR IUD 1 each by Intrauterine route once.    Marland Kitchen. albuterol (PROVENTIL HFA;VENTOLIN HFA) 108 (90 BASE) MCG/ACT inhaler Inhale into the lungs every 6 (six) hours as needed for wheezing or shortness of breath.      No results found for this or any previous visit (from the past 48 hour(s)). No results found.  ROS  No recent f/c/n/v/wt loss  Blood pressure 103/56, pulse 84, temperature 98.3 F (36.8 C), temperature source Oral, resp. rate 18, height 5\' 3"  (1.6 m), weight 60.328 kg (133 lb), SpO2 100 %. Physical Exam  wn wd woman in  nad.  A and O x 4.  Mood and affect normal.  EOMi.  resp unlabored.  R hallux with decreased motion at the MP joint.  Neg grind test.  Pain at extremes of ROM.  No lymphadenopathy.  5/5 strength in PF and DF of the toes.  Sens to LT intact at the forefoot.  Assessment/Plan Right hallux rigidus - to OR for right hallux MPJ cheilectomy.  The risks and benefits of the alternative treatment options have been discussed in detail.  The patient wishes to proceed with surgery and specifically understands risks of bleeding, infection, nerve damage, blood clots, need for additional surgery, amputation and death.   [name], [name] 08/10/2015, 7:31 AM

## 2015-08-10 NOTE — Transfer of Care (Signed)
Immediate Anesthesia Transfer of Care Note  Patient: Julie [name]  Procedure(s) Performed: Procedure(s): RIGHT HALLUX METATARSALPHALANGEAL JOINT  CHEILECTOMY (Right)  Patient Location: PACU  Anesthesia Type:GA combined with regional for post-op pain  Level of Consciousness: awake and patient cooperative  Airway & Oxygen Therapy: Patient Spontanous Breathing and Patient connected to face mask oxygen  Post-op Assessment: Report given to RN and Post -op Vital signs reviewed and stable  Post vital signs: Reviewed and stable  Last Vitals:  Filed Vitals:   08/10/15 0715 08/10/15 0720  BP: 103/56   Pulse: 79 84  Temp:    Resp: 16 18    Complications: No apparent anesthesia complications

## 2015-08-10 NOTE — Progress Notes (Signed)
Assisted Dr. Ivin Bootyrews with right, ultrasound guided saphenous block. Side rails up, monitors on throughout procedure. See vital signs in flow sheet. Tolerated Procedure well.

## 2015-08-11 ENCOUNTER — Encounter (HOSPITAL_BASED_OUTPATIENT_CLINIC_OR_DEPARTMENT_OTHER): Payer: Self-pay | Admitting: Orthopedic Surgery

## 2019-11-07 ENCOUNTER — Emergency Department (HOSPITAL_BASED_OUTPATIENT_CLINIC_OR_DEPARTMENT_OTHER): Payer: BC Managed Care – PPO

## 2019-11-07 ENCOUNTER — Other Ambulatory Visit: Payer: Self-pay

## 2019-11-07 ENCOUNTER — Inpatient Hospital Stay (HOSPITAL_BASED_OUTPATIENT_CLINIC_OR_DEPARTMENT_OTHER)
Admission: EM | Admit: 2019-11-07 | Discharge: 2019-11-11 | DRG: 177 | Disposition: A | Discharge status: Home / Self Care | Payer: BC Managed Care – PPO | Attending: Internal Medicine | Admitting: Internal Medicine

## 2019-11-07 ENCOUNTER — Encounter (HOSPITAL_BASED_OUTPATIENT_CLINIC_OR_DEPARTMENT_OTHER): Payer: Self-pay | Admitting: Emergency Medicine

## 2019-11-07 DIAGNOSIS — Z880 Allergy status to penicillin: Secondary | ICD-10-CM

## 2019-11-07 DIAGNOSIS — A0839 Other viral enteritis: Secondary | ICD-10-CM | POA: Diagnosis present

## 2019-11-07 DIAGNOSIS — J9601 Acute respiratory failure with hypoxia: Secondary | ICD-10-CM | POA: Diagnosis present

## 2019-11-07 DIAGNOSIS — Z79899 Other long term (current) drug therapy: Secondary | ICD-10-CM

## 2019-11-07 DIAGNOSIS — J45909 Unspecified asthma, uncomplicated: Secondary | ICD-10-CM | POA: Diagnosis present

## 2019-11-07 DIAGNOSIS — R197 Diarrhea, unspecified: Secondary | ICD-10-CM | POA: Diagnosis not present

## 2019-11-07 DIAGNOSIS — E876 Hypokalemia: Secondary | ICD-10-CM | POA: Diagnosis present

## 2019-11-07 DIAGNOSIS — K219 Gastro-esophageal reflux disease without esophagitis: Secondary | ICD-10-CM | POA: Diagnosis present

## 2019-11-07 DIAGNOSIS — Z87891 Personal history of nicotine dependence: Secondary | ICD-10-CM

## 2019-11-07 DIAGNOSIS — J1282 Pneumonia due to coronavirus disease 2019: Secondary | ICD-10-CM | POA: Diagnosis present

## 2019-11-07 DIAGNOSIS — R0902 Hypoxemia: Secondary | ICD-10-CM

## 2019-11-07 DIAGNOSIS — Z881 Allergy status to other antibiotic agents status: Secondary | ICD-10-CM | POA: Diagnosis not present

## 2019-11-07 DIAGNOSIS — Z9089 Acquired absence of other organs: Secondary | ICD-10-CM | POA: Diagnosis not present

## 2019-11-07 DIAGNOSIS — U071 COVID-19: Principal | ICD-10-CM | POA: Diagnosis present

## 2019-11-07 DIAGNOSIS — Z9049 Acquired absence of other specified parts of digestive tract: Secondary | ICD-10-CM

## 2019-11-07 DIAGNOSIS — A09 Infectious gastroenteritis and colitis, unspecified: Secondary | ICD-10-CM | POA: Diagnosis not present

## 2019-11-07 DIAGNOSIS — J4520 Mild intermittent asthma, uncomplicated: Secondary | ICD-10-CM | POA: Diagnosis not present

## 2019-11-07 DIAGNOSIS — Z79891 Long term (current) use of opiate analgesic: Secondary | ICD-10-CM

## 2019-11-07 DIAGNOSIS — R0602 Shortness of breath: Secondary | ICD-10-CM | POA: Diagnosis present

## 2019-11-07 LAB — CBC WITH DIFFERENTIAL/PLATELET
Abs Immature Granulocytes: 0.02 10*3/uL (ref 0.00–0.07)
Basophils Absolute: 0 10*3/uL (ref 0.0–0.1)
Basophils Relative: 1 %
Eosinophils Absolute: 0 10*3/uL (ref 0.0–0.5)
Eosinophils Relative: 1 %
HCT: 43.9 % (ref 36.0–46.0)
Hemoglobin: 14.2 g/dL (ref 12.0–15.0)
Immature Granulocytes: 1 %
Lymphocytes Relative: 23 %
Lymphs Abs: 1 10*3/uL (ref 0.7–4.0)
MCH: 28.7 pg (ref 26.0–34.0)
MCHC: 32.3 g/dL (ref 30.0–36.0)
MCV: 88.9 fL (ref 80.0–100.0)
Monocytes Absolute: 0.2 10*3/uL (ref 0.1–1.0)
Monocytes Relative: 4 %
Neutro Abs: 3.1 10*3/uL (ref 1.7–7.7)
Neutrophils Relative %: 70 %
Platelets: 225 10*3/uL (ref 150–400)
RBC: 4.94 MIL/uL (ref 3.87–5.11)
RDW: 13.2 % (ref 11.5–15.5)
WBC: 4.3 10*3/uL (ref 4.0–10.5)
nRBC: 0 % (ref 0.0–0.2)

## 2019-11-07 LAB — COMPREHENSIVE METABOLIC PANEL
ALT: 15 U/L (ref 0–44)
AST: 20 U/L (ref 15–41)
Albumin: 4.1 g/dL (ref 3.5–5.0)
Alkaline Phosphatase: 59 U/L (ref 38–126)
Anion gap: 12 (ref 5–15)
BUN: 9 mg/dL (ref 6–20)
CO2: 24 mmol/L (ref 22–32)
Calcium: 8.7 mg/dL — ABNORMAL LOW (ref 8.9–10.3)
Chloride: 101 mmol/L (ref 98–111)
Creatinine, Ser: 0.71 mg/dL (ref 0.44–1.00)
GFR calc Af Amer: >60 mL/min (ref >60)
GFR calc non Af Amer: >60 mL/min (ref >60)
Glucose, Bld: 100 mg/dL — ABNORMAL HIGH (ref 70–99)
Potassium: 3.3 mmol/L — ABNORMAL LOW (ref 3.5–5.1)
Sodium: 137 mmol/L (ref 135–145)
Total Bilirubin: 0.5 mg/dL (ref 0.3–1.2)
Total Protein: 7.2 g/dL (ref 6.5–8.1)

## 2019-11-07 LAB — MAGNESIUM: Magnesium: 2.3 mg/dL (ref 1.7–2.4)

## 2019-11-07 LAB — LACTATE DEHYDROGENASE: LDH: 232 U/L — ABNORMAL HIGH (ref 98–192)

## 2019-11-07 LAB — PROCALCITONIN: Procalcitonin: <0.1 ng/mL

## 2019-11-07 LAB — FERRITIN: Ferritin: 54 ng/mL (ref 11–307)

## 2019-11-07 LAB — C-REACTIVE PROTEIN: CRP: 0.5 mg/dL (ref <1.0)

## 2019-11-07 LAB — TRIGLYCERIDES: Triglycerides: 106 mg/dL (ref <150)

## 2019-11-07 LAB — LACTIC ACID, PLASMA: Lactic Acid, Venous: 1.2 mmol/L (ref 0.5–1.9)

## 2019-11-07 LAB — FIBRINOGEN: Fibrinogen: 372 mg/dL (ref 210–475)

## 2019-11-07 LAB — PREGNANCY, URINE: Preg Test, Ur: NEGATIVE

## 2019-11-07 LAB — D-DIMER, QUANTITATIVE: D-Dimer, Quant: 0.64 ug/mL-FEU — ABNORMAL HIGH (ref 0.00–0.50)

## 2019-11-07 MED ORDER — IOHEXOL 350 MG/ML SOLN
100.0000 mL | Freq: Once | INTRAVENOUS | Status: AC | PRN
  Administered 2019-11-07: 100 mL via INTRAVENOUS

## 2019-11-07 MED ORDER — ACETAMINOPHEN 325 MG PO TABS
650.0000 mg | ORAL_TABLET | Freq: Four times a day (QID) | ORAL | Status: DC | PRN
  Administered 2019-11-08 – 2019-11-10 (×4): 650 mg via ORAL
  Filled 2019-11-07 (×4): qty 2

## 2019-11-07 MED ORDER — SODIUM CHLORIDE 0.9 % IV SOLN
100.0000 mg | Freq: Every day | INTRAVENOUS | Status: AC
  Administered 2019-11-08 – 2019-11-11 (×4): 100 mg via INTRAVENOUS
  Filled 2019-11-07 (×4): qty 20

## 2019-11-07 MED ORDER — PNEUMOCOCCAL VAC POLYVALENT 25 MCG/0.5ML IJ INJ
0.5000 mL | INJECTION | INTRAMUSCULAR | Status: DC
  Filled 2019-11-07: qty 0.5

## 2019-11-07 MED ORDER — LOPERAMIDE HCL 2 MG PO CAPS: 2.0000 mg | ORAL_CAPSULE | ORAL | Status: DC | PRN

## 2019-11-07 MED ORDER — ENOXAPARIN SODIUM 40 MG/0.4ML ~~LOC~~ SOLN
40.0000 mg | SUBCUTANEOUS | Status: DC
  Administered 2019-11-07 – 2019-11-10 (×4): 40 mg via SUBCUTANEOUS
  Filled 2019-11-07 (×5): qty 0.4

## 2019-11-07 MED ORDER — POTASSIUM CHLORIDE CRYS ER 20 MEQ PO TBCR
30.0000 meq | EXTENDED_RELEASE_TABLET | Freq: Once | ORAL | Status: AC
  Administered 2019-11-07: 30 meq via ORAL
  Filled 2019-11-07: qty 1

## 2019-11-07 MED ORDER — HYDROCOD POLST-CPM POLST ER 10-8 MG/5ML PO SUER
5.0000 mL | Freq: Two times a day (BID) | ORAL | Status: DC | PRN
  Administered 2019-11-07: 5 mL via ORAL
  Filled 2019-11-07: qty 5

## 2019-11-07 MED ORDER — SODIUM CHLORIDE 0.9 % IV SOLN
100.0000 mg | Freq: Once | INTRAVENOUS | Status: AC
  Administered 2019-11-07: 100 mg via INTRAVENOUS
  Filled 2019-11-07: qty 20

## 2019-11-07 MED ORDER — GUAIFENESIN-DM 100-10 MG/5ML PO SYRP
10.0000 mL | ORAL_SOLUTION | ORAL | Status: DC | PRN
  Administered 2019-11-08 – 2019-11-09 (×4): 10 mL via ORAL
  Filled 2019-11-07 (×5): qty 10

## 2019-11-07 MED ORDER — ALBUTEROL SULFATE HFA 108 (90 BASE) MCG/ACT IN AERS: 1.0000 | INHALATION_SPRAY | RESPIRATORY_TRACT | Status: DC | PRN

## 2019-11-07 MED ORDER — DEXAMETHASONE SODIUM PHOSPHATE 10 MG/ML IJ SOLN
6.0000 mg | Freq: Every day | INTRAMUSCULAR | Status: DC
  Administered 2019-11-07 – 2019-11-10 (×4): 6 mg via INTRAVENOUS
  Filled 2019-11-07 (×4): qty 1

## 2019-11-07 MED ORDER — IOHEXOL 300 MG/ML  SOLN: 100.0000 mL | Freq: Once | INTRAMUSCULAR | Status: DC | PRN

## 2019-11-07 MED ORDER — ASCORBIC ACID 500 MG PO TABS
500.0000 mg | ORAL_TABLET | Freq: Every day | ORAL | Status: DC
  Administered 2019-11-07 – 2019-11-11 (×5): 500 mg via ORAL
  Filled 2019-11-07 (×5): qty 1

## 2019-11-07 MED ORDER — ZINC SULFATE 220 (50 ZN) MG PO CAPS
220.0000 mg | ORAL_CAPSULE | Freq: Every day | ORAL | Status: DC
  Administered 2019-11-07 – 2019-11-11 (×5): 220 mg via ORAL
  Filled 2019-11-07 (×5): qty 1

## 2019-11-07 NOTE — ED Provider Notes (Addendum)
[facility] EMERGENCY DEPARTMENT Provider Note   CSN: [id number] Arrival date & time: 11/07/19  1307     History Chief Complaint  Patient presents with  . Cough    COVID+    Julie [name] is a 49 y.o. female.  Patient is a 49 year old female who presents with worsening cough and shortness of breath.  Positive for Covid 4 days ago.  She is started having symptoms the day before that which was 5 days ago.  She says over the last 2 days she has had worsening cough and some worsening shortness of breath.  She does have an underlying history of asthma but she has not used inhaler in about 2 years.  She is not on home oxygen.  She has some discomfort in the center of her chest which she says is related to coughing.  She denies any known fevers.  No nausea or vomiting.  She has had some loose stools.  She has had some myalgias and intermittent headaches.        Past Medical History:  Diagnosis Date  . Anxiety   . Asthma   . Carpal tunnel syndrome of right wrist   . GERD (gastroesophageal reflux disease)   . Headache(784.0)   . PONV (postoperative nausea and vomiting)     Patient Active Problem List   Diagnosis Date Noted  . Acute respiratory failure with hypoxia (HCC) 11/07/2019    Past Surgical History:  Procedure Laterality Date  . APPENDECTOMY    . BREAST LUMPECTOMY Right   . BREAST SURGERY     augmentation  . CARPAL TUNNEL RELEASE Right 08/02/2013   Procedure: RIGHT CARPAL TUNNEL RELEASE;  Surgeon: Nicki Reaper, MD;  Location: [facility];  Service: Orthopedics;  Laterality: Right;  . CESAREAN SECTION     x3  . CHEILECTOMY Right 08/10/2015   Procedure: RIGHT HALLUX METATARSALPHALANGEAL JOINT  CHEILECTOMY;  Surgeon: Toni Arthurs, MD;  Location: [facility];  Service: Orthopedics;  Laterality: Right;  . TONSILLECTOMY       OB History   No obstetric history on file.     No family history on file.  Social History   Tobacco  Use  . Smoking status: Former Smoker    Packs/day: 0.50    Quit date: 07/12/1992    Years since quitting: 27.3  . Smokeless tobacco: Never Used  Substance Use Topics  . Alcohol use: No  . Drug use: No    Home Medications Prior to Admission medications   Medication Sig Start Date End Date Taking? Authorizing Provider  albuterol (PROVENTIL HFA;VENTOLIN HFA) 108 (90 BASE) MCG/ACT inhaler Inhale into the lungs every 6 (six) hours as needed for wheezing or shortness of breath.    [provider]  docusate sodium (COLACE) 100 MG capsule Take 1 capsule (100 mg total) by mouth 2 (two) times daily. While taking narcotic pain medicine. 08/10/15   Jacinta Shoe, PA-C  ibuprofen (ADVIL,MOTRIN) 200 MG tablet Take 200 mg by mouth every 6 (six) hours as needed.    [provider]  levonorgestrel (MIRENA) 20 MCG/24HR IUD 1 each by Intrauterine route once.    [provider]  oxyCODONE (ROXICODONE) 5 MG immediate release tablet Take 1-2 tablets (5-10 mg total) by mouth every 4 (four) hours as needed for moderate pain or severe pain. 08/10/15   Jacinta Shoe, PA-C  senna (SENOKOT) 8.6 MG TABS tablet Take 2 tablets (17.2 mg total) by mouth 2 (  two) times daily. 08/10/15   Jacinta Shoe, PA-C    Allergies    Penicillins, Vancomycin, and Amoxicillin  Review of Systems   Review of Systems  Constitutional: Positive for fatigue. Negative for chills, diaphoresis and fever.  HENT: Positive for congestion and rhinorrhea. Negative for sneezing.   Eyes: Negative.   Respiratory: Positive for cough and shortness of breath. Negative for chest tightness.   Cardiovascular: Positive for chest pain. Negative for leg swelling.  Gastrointestinal: Positive for diarrhea. Negative for abdominal pain, blood in stool, nausea and vomiting.  Genitourinary: Negative for difficulty urinating, flank pain, frequency and hematuria.  Musculoskeletal: Positive for myalgias. Negative for  arthralgias and back pain.  Skin: Negative for rash.  Neurological: Positive for headaches. Negative for dizziness, speech difficulty, weakness and numbness.    Physical Exam Updated Vital Signs BP 118/71   Pulse 89   Temp 98 F (36.7 C) (Oral)   Resp 16   Ht 5\' 2"  (1.575 m)   Wt 62.6 kg   SpO2 100%   BMI 25.24 kg/m   Physical Exam Constitutional:      Appearance: She is well-developed.  HENT:     Head: Normocephalic and atraumatic.  Eyes:     Pupils: Pupils are equal, round, and reactive to light.  Cardiovascular:     Rate and Rhythm: Normal rate and regular rhythm.     Heart sounds: Normal heart sounds.  Pulmonary:     Effort: Pulmonary effort is normal. No respiratory distress.     Breath sounds: Rales present. No wheezing.  Chest:     Chest wall: No tenderness.  Abdominal:     General: Bowel sounds are normal.     Palpations: Abdomen is soft.     Tenderness: There is no abdominal tenderness. There is no guarding or rebound.  Musculoskeletal:        General: Normal range of motion.     Cervical back: Normal range of motion and neck supple.     Comments: No edema or calf tenderness  Lymphadenopathy:     Cervical: No cervical adenopathy.  Skin:    General: Skin is warm and dry.     Findings: No rash.  Neurological:     Mental Status: She is alert and oriented to person, place, and time.     ED Results / Procedures / Treatments   Labs (all labs ordered are listed, but only abnormal results are displayed) Labs Reviewed  COMPREHENSIVE METABOLIC PANEL - Abnormal; Notable for the following components:      Result Value   Potassium 3.3 (*)    Glucose, Bld 100 (*)    Calcium 8.7 (*)    All other components within normal limits  D-DIMER, QUANTITATIVE (NOT AT [facility]) - Abnormal; Notable for the following components:   D-Dimer, Quant 0.64 (*)    All other components within normal limits  LACTATE DEHYDROGENASE - Abnormal; Notable for the following components:    LDH 232 (*)    All other components within normal limits  CULTURE, BLOOD (ROUTINE X 2)  CULTURE, BLOOD (ROUTINE X 2)  LACTIC ACID, PLASMA  CBC WITH DIFFERENTIAL/PLATELET  PROCALCITONIN  FERRITIN  TRIGLYCERIDES  FIBRINOGEN  C-REACTIVE PROTEIN  PREGNANCY, URINE      EKG EKG Interpretation  Date/Time:  Sunday November 07 2019 15:37:28 EDT Ventricular Rate:  72 PR Interval:    QRS Duration: 90 QT Interval:  395 QTC Calculation: 433 R Axis:   54 Text Interpretation: Sinus rhythm  Low voltage, precordial leads No old tracing to compare Confirmed by Rolan Bucco [phone]) on 11/07/2019 4:29:44 PM   Radiology CT Angio Chest PE W/Cm &/Or Wo Cm  Result Date: 11/07/2019 CLINICAL DATA:  PE suspected. Fever and shortness of breath. Recent COVID diagnosis. EXAM: CT ANGIOGRAPHY CHEST WITH CONTRAST TECHNIQUE: Multidetector CT imaging of the chest was performed using the standard protocol during bolus administration of intravenous contrast. Multiplanar CT image reconstructions and MIPs were obtained to evaluate the vascular anatomy. CONTRAST:  OMNIPAQUE IOHEXOL 350 MG/ML SOLN COMPARISON:  None. FINDINGS: Cardiovascular: Contrast injection is sufficient to demonstrate satisfactory opacification of the pulmonary arteries to the segmental level. There is no pulmonary embolus. The main pulmonary artery is within normal limits for size. There is no CT evidence of acute right heart strain. The visualized aorta is normal. Heart size is normal, without pericardial effusion. Mediastinum/Nodes: --No mediastinal or hilar lymphadenopathy. --No axillary lymphadenopathy. --No supraclavicular lymphadenopathy. --Normal thyroid gland. --The esophagus is unremarkable Lungs/Pleura: There are ground-glass airspace opacities in the lower lobes bilaterally, left worse than right. There is no pneumothorax or significant pleural effusion. The trachea is unremarkable. Upper Abdomen: No acute abnormality. Musculoskeletal: No  chest wall abnormality. No acute or significant osseous findings. Review of the MIP images confirms the above findings. IMPRESSION: 1. No evidence of pulmonary embolus. 2. Bilateral ground-glass airspace opacities in the lower lobes, left worse than right, which are concerning for an atypical infectious process in the appropriate clinical setting. Electronically Signed   By: Katherine Mantle M.D.   On: 11/07/2019 16:52   DG Chest Portable 1 View  Result Date: 11/07/2019 CLINICAL DATA:  Positive COVID-19 test 5 days ago, worsening shortness of breath and cough, previous tobacco abuse EXAM: PORTABLE CHEST 1 VIEW COMPARISON:  None. FINDINGS: The heart size and mediastinal contours are within normal limits. Both lungs are clear. The visualized skeletal structures are unremarkable. IMPRESSION: No active disease. Electronically Signed   By: Sharlet Salina M.D.   On: 11/07/2019 15:11    Procedures Procedures (including critical care time)  Medications Ordered in ED Medications  dexamethasone (DECADRON) injection 6 mg (6 mg Intravenous Given 11/07/19 1806)  enoxaparin (LOVENOX) injection 40 mg (40 mg Subcutaneous Given 11/07/19 1804)  remdesivir 100 mg in sodium chloride 0.9 % 100 mL IVPB (0 mg Intravenous Stopped 11/07/19 1852)    Followed by  remdesivir 100 mg in sodium chloride 0.9 % 100 mL IVPB (100 mg Intravenous New Bag/Given 11/07/19 1853)    Followed by  remdesivir 100 mg in sodium chloride 0.9 % 100 mL IVPB (has no administration in time range)  iohexol (OMNIPAQUE) 350 MG/ML injection 100 mL (100 mLs Intravenous Contrast Given 11/07/19 1625)    ED Course  I have reviewed the triage vital signs and the nursing notes.  Pertinent labs & imaging results that were available during my care of the patient were reviewed by me and considered in my medical decision making (see chart for details).    MDM Rules/Calculators/A&P                      Patient is a 49 year old female who has had some  worsening Covid symptoms.  She has some mild tachypnea but no hypoxia at rest.  She was noted to be hypoxic with minimal exertion while walking around the room.  Her sats dropped down to 86% and her heart rate went up into the 110s.  Her labs are nonconcerning.  Her vital  signs are otherwise stable.  Her chest x-ray was normal without evidence of pneumonia.  Given her normal chest x-ray with hypoxia, CT chest was ordered which shows no evidence of PE although it does show the typical bibasilar opacities found with Covid.  I spoke with Dr. Olevia Bowens who has accepted the patient for admission to [facility].  She was started on remdesivir, steroids and DVT prophylaxis.  CRITICAL CARE Performed by: Malvin Johns Total critical care time: 60 minutes Critical care time was exclusive of separately billable procedures and treating other patients. Critical care was necessary to treat or prevent imminent or life-threatening deterioration. Critical care was time spent personally by me on the following activities: development of treatment plan with patient and/or surrogate as well as nursing, discussions with consultants, evaluation of patient's response to treatment, examination of patient, obtaining history from patient or surrogate, ordering and performing treatments and interventions, ordering and review of laboratory studies, ordering and review of radiographic studies, pulse oximetry and re-evaluation of patient's condition.  Final Clinical Impression(s) / ED Diagnoses Final diagnoses:  Pneumonia due to COVID-19 virus  Hypoxia    Rx / DC Orders ED Discharge Orders    None       Malvin Johns, MD 11/07/19 1900    Malvin Johns, MD 11/07/19 1901

## 2019-11-07 NOTE — ED Triage Notes (Signed)
Dx with COVID Wednesday. C/o ongoing cough and difficulty taking a deep breath.

## 2019-11-07 NOTE — H&P (Addendum)
History and Physical    Julie [name] HWE:[id number] DOB: 1970-10-12 DOA: 11/07/2019  PCP: Brooke Bonito, MD Patient coming from: [facility] ED  Chief Complaint: Shortness of breath, Covid positive  HPI: Julie [name] is a 49 y.o. female with medical history significant of anxiety, asthma, GERD presented to the ED with complaints of shortness of breath and cough.  Patient tested positive for COVID-19 on 11/03/2019.  Patient states her symptoms started the day before she was tested for Covid.  She is having cough, shortness of breath, body aches, and diarrhea.  States her chest hurts whenever she coughs a lot or takes a deep breath, no chest pain otherwise.  She was previously having fevers but fever stopped 24 hours ago.  Denies nausea, vomiting, or abdominal pain.    ED Course: Afebrile.  Mildly tachypneic but not hypoxic at rest.  Noted to be hypoxic with minimal exertion while walking around the room.  Her oxygen saturation dropped down to 86% and her heart rate went up to the 110s.  Labs showing no leukocytosis.  Lactic acid normal.  Procalcitonin <0.10.  Potassium 3.3.  D-dimer 0.64, LDH 232.  Ferritin, triglycerides, fibrinogen, and CRP normal.  Urine pregnancy test negative.  Blood culture x2 pending. Chest x-ray without evidence of pneumonia. CT angiogram negative for PE but showing bibasilar groundglass airspace opacities in the lower lobes, left worse than right.  Findings consistent with Covid pneumonia.  Patient was started on Decadron, remdesivir, and Lovenox for DVT prophylaxis.  Review of Systems:  All systems reviewed and apart from history of presenting illness, are negative.  Past Medical History:  Diagnosis Date  . Anxiety   . Asthma   . Carpal tunnel syndrome of right wrist   . GERD (gastroesophageal reflux disease)   . Headache(784.0)   . PONV (postoperative nausea and vomiting)     Past Surgical History:  Procedure Laterality Date  . APPENDECTOMY     . BREAST LUMPECTOMY Right   . BREAST SURGERY     augmentation  . CARPAL TUNNEL RELEASE Right 08/02/2013   Procedure: RIGHT CARPAL TUNNEL RELEASE;  Surgeon: Nicki Reaper, MD;  Location: [facility];  Service: Orthopedics;  Laterality: Right;  . CESAREAN SECTION     x3  . CHEILECTOMY Right 08/10/2015   Procedure: RIGHT HALLUX METATARSALPHALANGEAL JOINT  CHEILECTOMY;  Surgeon: Toni Arthurs, MD;  Location: [facility];  Service: Orthopedics;  Laterality: Right;  . TONSILLECTOMY       reports that she quit smoking about 27 years ago. She smoked 0.50 packs per day. She has never used smokeless tobacco. She reports that she does not drink alcohol or use drugs.  Allergies  Allergen Reactions  . Penicillins Hives  . Vancomycin Hives  . Amoxicillin Rash    History reviewed. No pertinent family history.  Prior to Admission medications   Medication Sig Start Date End Date Taking? Authorizing Provider  albuterol (PROVENTIL HFA;VENTOLIN HFA) 108 (90 BASE) MCG/ACT inhaler Inhale into the lungs every 6 (six) hours as needed for wheezing or shortness of breath.    [provider]  docusate sodium (COLACE) 100 MG capsule Take 1 capsule (100 mg total) by mouth 2 (two) times daily. While taking narcotic pain medicine. 08/10/15   Jacinta Shoe, PA-C  ibuprofen (ADVIL,MOTRIN) 200 MG tablet Take 200 mg by mouth every 6 (six) hours as needed.    [provider]  levonorgestrel (MIRENA) 20 MCG/24HR IUD 1  each by Intrauterine route once.    [provider]  oxyCODONE (ROXICODONE) 5 MG immediate release tablet Take 1-2 tablets (5-10 mg total) by mouth every 4 (four) hours as needed for moderate pain or severe pain. 08/10/15   Jacinta Shoe, PA-C  senna (SENOKOT) 8.6 MG TABS tablet Take 2 tablets (17.2 mg total) by mouth 2 (two) times daily. 08/10/15   Jacinta Shoe, PA-C    Physical Exam: Vitals:   11/07/19 1805 11/07/19 1900 11/07/19  2012 11/07/19 2013  BP: 118/71 103/68 96/80   Pulse: 89 82 93   Resp: 16  18   Temp:   97.9 F (36.6 C)   TempSrc:   Oral   SpO2: 100% 99% 100%   Weight:    63.2 kg  Height:    5' 2.5" (1.588 m)    Physical Exam  Constitutional: She is oriented to person, place, and time. She appears well-developed and well-nourished. No distress.  HENT:  Head: Normocephalic.  Eyes: Right eye exhibits no discharge. Left eye exhibits no discharge.  Cardiovascular: Normal rate, regular rhythm and intact distal pulses.  Pulmonary/Chest: Effort normal. She has no wheezes. She has no rales.  Coughing  Abdominal: Soft. Bowel sounds are normal. She exhibits no distension. There is no abdominal tenderness. There is no guarding.  Musculoskeletal:        General: No edema.     Cervical back: Neck supple.  Neurological: She is alert and oriented to person, place, and time.  Skin: Skin is warm and dry. She is not diaphoretic.     Labs on Admission: I have personally reviewed following labs and imaging studies      CBC: Recent Labs  Lab 11/07/19 1517  WBC 4.3  NEUTROABS 3.1  HGB 14.2  HCT 43.9  MCV 88.9  PLT 225   Basic Metabolic Panel: Recent Labs  Lab 11/07/19 1517  NA 137  K 3.3*  CL 101  CO2 24  GLUCOSE 100*  BUN 9  CREATININE 0.71  CALCIUM 8.7*   GFR: Estimated Creatinine Clearance: 75.3 mL/min (by C-G formula based on SCr of 0.71 mg/dL). Liver Function Tests: Recent Labs  Lab 11/07/19 1517  AST 20  ALT 15  ALKPHOS 59  BILITOT 0.5  PROT 7.2  ALBUMIN 4.1   No results for input(s): LIPASE, AMYLASE in the last 168 hours. No results for input(s): AMMONIA in the last 168 hours. Coagulation Profile: No results for input(s): INR, PROTIME in the last 168 hours. Cardiac Enzymes: No results for input(s): CKTOTAL, CKMB, CKMBINDEX, TROPONINI in the last 168 hours. BNP (last 3 results) No results for input(s): PROBNP in the last 8760 hours. HbA1C: No results for input(s):  HGBA1C in the last 72 hours. CBG: No results for input(s): GLUCAP in the last 168 hours. Lipid Profile: Recent Labs    11/07/19 1517  TRIG 106   Thyroid Function Tests: No results for input(s): TSH, T4TOTAL, FREET4, T3FREE, THYROIDAB in the last 72 hours. Anemia Panel: Recent Labs    11/07/19 1517  FERRITIN 54   Urine analysis: No results found for: COLORURINE, APPEARANCEUR, LABSPEC, PHURINE, GLUCOSEU, HGBUR, BILIRUBINUR, KETONESUR, PROTEINUR, UROBILINOGEN, NITRITE, LEUKOCYTESUR    Radiological Exams on Admission: CT Angio Chest PE W/Cm &/Or Wo Cm  Result Date: 11/07/2019 CLINICAL DATA:  PE suspected. Fever and shortness of breath. Recent COVID diagnosis. EXAM: CT ANGIOGRAPHY CHEST WITH CONTRAST TECHNIQUE: Multidetector CT imaging of the chest was performed using the standard protocol during bolus administration  of intravenous contrast. Multiplanar CT image reconstructions and MIPs were obtained to evaluate the vascular anatomy. CONTRAST:  122mL OMNIPAQUE IOHEXOL 350 MG/ML SOLN COMPARISON:  None. FINDINGS: Cardiovascular: Contrast injection is sufficient to demonstrate satisfactory opacification of the pulmonary arteries to the segmental level. There is no pulmonary embolus. The main pulmonary artery is within normal limits for size. There is no CT evidence of acute right heart strain. The visualized aorta is normal. Heart size is normal, without pericardial effusion. Mediastinum/Nodes: --No mediastinal or hilar lymphadenopathy. --No axillary lymphadenopathy. --No supraclavicular lymphadenopathy. --Normal thyroid gland. --The esophagus is unremarkable Lungs/Pleura: There are ground-glass airspace opacities in the lower lobes bilaterally, left worse than right. There is no pneumothorax or significant pleural effusion. The trachea is unremarkable. Upper Abdomen: No acute abnormality. Musculoskeletal: No chest wall abnormality. No acute or significant osseous findings. Review of the MIP images  confirms the above findings. IMPRESSION: 1. No evidence of pulmonary embolus. 2. Bilateral ground-glass airspace opacities in the lower lobes, left worse than right, which are concerning for an atypical infectious process in the appropriate clinical setting. Electronically Signed   By: Constance Holster M.D.   On: 11/07/2019 16:52   DG Chest Portable 1 View  Result Date: 11/07/2019 CLINICAL DATA:  Positive COVID-19 test 5 days ago, worsening shortness of breath and cough, previous tobacco abuse EXAM: PORTABLE CHEST 1 VIEW COMPARISON:  None. FINDINGS: The heart size and mediastinal contours are within normal limits. Both lungs are clear. The visualized skeletal structures are unremarkable. IMPRESSION: No active disease. Electronically Signed   By: Randa Ngo M.D.   On: 11/07/2019 15:11    EKG: Independently reviewed.  Sinus rhythm.  No prior tracing for comparison.  Assessment/Plan Principal Problem:   Pneumonia due to COVID-19 virus Active Problems:   Acute respiratory failure with hypoxia (HCC)   Asthma   Diarrhea   Hypokalemia   Acute hypoxic respiratory failure secondary to COVID-19 viral multifocal pneumonia: Afebrile.  Mildly tachypneic but not hypoxic at rest.  Noted to be hypoxic with minimal exertion while walking around the room in the ED.  Her oxygen saturation dropped down to 86% and her heart rate went up to the 110s.  Labs showing no leukocytosis.  Lactic acid normal.  Procalcitonin <0.10.  D-dimer 0.64, LDH 232.  Ferritin, triglycerides, fibrinogen, and CRP normal.  CT angiogram negative for PE but showing bibasilar groundglass airspace opacities consistent with COVID-19 viral pneumonia. -Remdesivir dosing per pharmacy -IV Decadron 6 mg daily -Vitamin C, zinc -Antitussives as needed -Tylenol as needed -Daily CBC with differential, CMP, CRP, D-dimer, LDH -Airborne and contact precautions -Continuous pulse ox -Supplemental oxygen as needed to keep oxygen saturation above  90% -Blood culture x2 pending  Diarrhea: Suspect related to COVID-19 viral infection.  No complaints of nausea, vomiting, or abdominal pain.  Abdominal exam benign.  Order GI pathogen panel.  Give loperamide as needed.  Mild hypokalemia: Likely related to diarrhea/ decreased p.o. intake.  Replete potassium.  Check magnesium level and replete if low.  Continue to monitor electrolytes.  Asthma: Stable.  No bronchospasm.  Albuterol inhaler as needed.  HIV screening: The patient falls between the ages of 13-64 and should be screened for HIV, therefore HIV testing ordered.  DVT prophylaxis: Lovenox Code Status: Full code Family Communication: No family available at this time. Disposition Plan: Anticipate discharge after clinical improvement.  Currently hypoxic and tachycardic with minimal exertion. Consults called: None Admission status: It is my clinical opinion that admission to INPATIENT is  reasonable and necessary because of the expectation that this patient will require hospital care that crosses at least 2 midnights to treat this condition based on the medical complexity of the problems presented.  Given the aforementioned information, the predictability of an adverse outcome is felt to be significant.  The medical decision making on this patient was of high complexity and the patient is at high risk for clinical deterioration, therefore this is a level 3 visit.  John Giovanni MD [facility]  If 7PM-7AM, please contact night-coverage [email or web address]  11/07/2019, 8:59 PM

## 2019-11-07 NOTE — ED Notes (Signed)
Pt ambulated in room while monitoring SpO2. Noted a drop to 86% on R/A. Pt had labored breathing with exertion and HR increased to 110. V/S quickly returned to normal after rest.

## 2019-11-07 NOTE — ED Notes (Signed)
ED Provider at bedside. 

## 2019-11-07 NOTE — ED Notes (Signed)
Report given to Ben RN with [facility]  

## 2019-11-07 NOTE — ED Notes (Signed)
Patient transported to CT 

## 2019-11-08 ENCOUNTER — Encounter (HOSPITAL_COMMUNITY): Payer: Self-pay | Admitting: Internal Medicine

## 2019-11-08 DIAGNOSIS — J9601 Acute respiratory failure with hypoxia: Secondary | ICD-10-CM

## 2019-11-08 DIAGNOSIS — J4520 Mild intermittent asthma, uncomplicated: Secondary | ICD-10-CM

## 2019-11-08 DIAGNOSIS — A09 Infectious gastroenteritis and colitis, unspecified: Secondary | ICD-10-CM

## 2019-11-08 LAB — CBC WITH DIFFERENTIAL/PLATELET
Abs Immature Granulocytes: 0.01 10*3/uL (ref 0.00–0.07)
Basophils Absolute: 0 10*3/uL (ref 0.0–0.1)
Basophils Relative: 0 %
Eosinophils Absolute: 0 10*3/uL (ref 0.0–0.5)
Eosinophils Relative: 0 %
HCT: 41.7 % (ref 36.0–46.0)
Hemoglobin: 13.4 g/dL (ref 12.0–15.0)
Immature Granulocytes: 0 %
Lymphocytes Relative: 19 %
Lymphs Abs: 0.5 10*3/uL — ABNORMAL LOW (ref 0.7–4.0)
MCH: 28.3 pg (ref 26.0–34.0)
MCHC: 32.1 g/dL (ref 30.0–36.0)
MCV: 88.2 fL (ref 80.0–100.0)
Monocytes Absolute: 0.1 10*3/uL (ref 0.1–1.0)
Monocytes Relative: 5 %
Neutro Abs: 2.2 10*3/uL (ref 1.7–7.7)
Neutrophils Relative %: 76 %
Platelets: 218 10*3/uL (ref 150–400)
RBC: 4.73 MIL/uL (ref 3.87–5.11)
RDW: 13.2 % (ref 11.5–15.5)
WBC: 2.9 10*3/uL — ABNORMAL LOW (ref 4.0–10.5)
nRBC: 0 % (ref 0.0–0.2)

## 2019-11-08 LAB — COMPREHENSIVE METABOLIC PANEL
ALT: 16 U/L (ref 0–44)
AST: 21 U/L (ref 15–41)
Albumin: 3.7 g/dL (ref 3.5–5.0)
Alkaline Phosphatase: 56 U/L (ref 38–126)
Anion gap: 10 (ref 5–15)
BUN: 14 mg/dL (ref 6–20)
CO2: 22 mmol/L (ref 22–32)
Calcium: 9 mg/dL (ref 8.9–10.3)
Chloride: 107 mmol/L (ref 98–111)
Creatinine, Ser: 0.7 mg/dL (ref 0.44–1.00)
GFR calc Af Amer: >60 mL/min (ref >60)
GFR calc non Af Amer: >60 mL/min (ref >60)
Glucose, Bld: 112 mg/dL — ABNORMAL HIGH (ref 70–99)
Potassium: 4.1 mmol/L (ref 3.5–5.1)
Sodium: 139 mmol/L (ref 135–145)
Total Bilirubin: 0.5 mg/dL (ref 0.3–1.2)
Total Protein: 6.7 g/dL (ref 6.5–8.1)

## 2019-11-08 LAB — HIV ANTIBODY (ROUTINE TESTING W REFLEX): HIV Screen 4th Generation wRfx: NONREACTIVE

## 2019-11-08 LAB — LACTATE DEHYDROGENASE: LDH: 179 U/L (ref 98–192)

## 2019-11-08 LAB — ABO/RH: ABO/RH(D): A POS

## 2019-11-08 LAB — D-DIMER, QUANTITATIVE: D-Dimer, Quant: 0.45 ug/mL-FEU (ref 0.00–0.50)

## 2019-11-08 LAB — C-REACTIVE PROTEIN: CRP: 0.8 mg/dL (ref <1.0)

## 2019-11-08 MED ORDER — HYDROCOD POLST-CPM POLST ER 10-8 MG/5ML PO SUER
5.0000 mL | Freq: Two times a day (BID) | ORAL | Status: DC
  Administered 2019-11-08 – 2019-11-11 (×7): 5 mL via ORAL
  Filled 2019-11-08 (×7): qty 5

## 2019-11-08 MED ORDER — BENZONATATE 100 MG PO CAPS
200.0000 mg | ORAL_CAPSULE | Freq: Three times a day (TID) | ORAL | Status: DC
  Administered 2019-11-08 – 2019-11-11 (×10): 200 mg via ORAL
  Filled 2019-11-08 (×10): qty 2

## 2019-11-08 NOTE — TOC Progression Note (Signed)
Transition of Care [facility]) - Progression Note    Patient Details  Name: Julie [name] MRN: [id number] Date of Birth: 1971-07-31  [facility] CM/SW Contact  Geni Bers, RN Phone Number: 11/08/2019, 4:19 PM  Clinical Narrative:    TOC will continue to follow for discharge needs.    Expected Discharge Plan: Home/Self Care Barriers to Discharge: No Barriers Identified  Expected Discharge Plan and Services Expected Discharge Plan: Home/Self Care       Living arrangements for the past 2 months: Single Family Home                                       Social Determinants of Health (SDOH) Interventions    Readmission Risk Interventions No flowsheet data found.

## 2019-11-08 NOTE — Progress Notes (Signed)
[facility]                                                                              Patient Demographics  Julie [name], is a 49 y.o. female, DOB - 1970/12/28, BJS:[id number]  Admit date - 11/07/2019   Admitting Physician John Giovanni, MD  Outpatient Primary MD for the patient is Brooke Bonito, MD  Outpatient specialists:   LOS - 1  days   Medical records reviewed and are as summarized below:    Chief Complaint  Patient presents with  . Cough    COVID+       Brief summary   Patient is a 49 year old female with anxiety, asthma, GERD presented to ED with complaints of shortness of breath, coughing.  She had tested positive for COVID-19 on 11/03/2019.  Reported coughing, shortness of breath, body aches, diarrhea.  Reported fever has improved in the last 24 hours PTA. COVID-19 positive Whole family has COVID-19 including her husband and kids  In ED, was noted to be hypoxic with minimal exertion, O2 sats dropped into 80s and heart rate worsened to 110s.  CT angiogram negative for PE but showed bibasilar groundglass opacities in lower lobes left worse than right  Assessment & Plan    Principal Problem:  Acute hypoxic respiratory failure due to acute COVID-19 viral pneumonia during the ongoing COVID-19 pandemic- POA - Patient presented with hypoxia with exertion, body aches diarrhea, shortness of breath and coughing.  CTA negative for PE but showed bibasilar groundglass opacities in both lobes. - Currently not hypoxic at rest however hypoxic on minimal exertion. -Patient started on Decadron 6 mg IV daily, Remdesivir per pharmacy protocol - Continue Supportive care: vitamin C/zinc, albuterol, Tylenol, Tessalon Perles, Tussionex for cough - Continue to wean oxygen, ambulatory O2 screening daily as tolerated  - Oxygen - SpO2: 100 % - Continue to follow labs as below  No results found for: SARSCOV2NAA   Recent Labs  Lab 11/07/19 1517 11/08/19 0434    DDIMER 0.64* 0.45  FERRITIN 54  --   CRP 0.5 0.8  ALT 15 16  PROCALCITON <0.10  --     Active Problems:    Asthma -Currently stable, continue albuterol inhaler as needed    Diarrhea -Secondary to COVID-19, continue loperamide as needed    Hypokalemia -Resolved   Code Status: Full CODE STATUS DVT Prophylaxis:  Lovenox  Family Communication: Discussed all imaging results, lab results, explained to the patient.  Called patient's husband, unable to make contact   Disposition Plan: Patient from home, anticipate discharge home once remdesivir is completed, day #2 today   Time Spent in minutes 35 minutes  Procedures:  None  Consultants:   None  Antimicrobials:   Anti-infectives (From admission, onward)   Start     Dose/Rate Route Frequency Ordered Stop   11/08/19 1000  remdesivir 100 mg in sodium chloride 0.9 % 100 mL IVPB     100 mg 200 mL/hr over 30 Minutes Intravenous Daily 11/07/19 1726 11/12/19 0959   11/07/19 1800  remdesivir 100 mg in sodium chloride 0.9 % 100 mL IVPB     100 mg  200 mL/hr over 30 Minutes Intravenous  Once 11/07/19 1726 11/07/19 1923   11/07/19 1730  remdesivir 100 mg in sodium chloride 0.9 % 100 mL IVPB     100 mg 200 mL/hr over 30 Minutes Intravenous Once 11/07/19 1726 11/07/19 1852          Medications  Scheduled Meds: . vitamin C  500 mg Oral Daily  . benzonatate  200 mg Oral TID  . chlorpheniramine-HYDROcodone  5 mL Oral Q12H  . dexamethasone (DECADRON) injection  6 mg Intravenous Daily  . enoxaparin (LOVENOX) injection  40 mg Subcutaneous Q24H  . pneumococcal 23 valent vaccine  0.5 mL Intramuscular Tomorrow-1000  . zinc sulfate  220 mg Oral Daily   Continuous Infusions: . remdesivir 100 mg in NS 100 mL 100 mg (11/08/19 0916)   PRN Meds:.acetaminophen, albuterol, chlorpheniramine-HYDROcodone, guaiFENesin-dextromethorphan, loperamide      Subjective:   Salle Brandle was seen and examined today.  Coughing, no nausea  vomiting or abdominal pain.  Currently no fevers.  Has not ambulated since admission.  No hypoxia at rest. Patient denies dizziness, abdominal pain, N/V/, new weakness, numbess, tingling. No acute events overnight.  + Diarrhea  Objective:   Vitals:   11/07/19 2354 11/08/19 0445 11/08/19 0855 11/08/19 0944  BP: 109/70 116/70  105/67  Pulse: 79 73 89 75  Resp: 17   18  Temp: 98.9 F (37.2 C) 98.6 F (37 C)  98.1 F (36.7 C)  TempSrc: Oral Oral  Axillary  SpO2: 99% 99% 97% 100%  Weight:      Height:        Intake/Output Summary (Last 24 hours) at 11/08/2019 1106 Last data filed at 11/08/2019 0456 Gross per 24 hour  Intake 340 ml  Output 600 ml  Net -260 ml     Wt Readings from Last 3 Encounters:  11/07/19 63.2 kg  08/10/15 60.3 kg  08/02/13 56.7 kg     Exam  General: Alert and oriented x 3, NAD, coughing  Cardiovascular: S1 S2 auscultated, no murmurs, RRR  Respiratory: Clear to auscultation bilaterally, no wheezing, rales or rhonchi  Gastrointestinal: Soft, nontender, nondistended, + bowel sounds  Ext: no pedal edema bilaterally  Neuro: Strength 5/5 upper and lower extremities bilaterally  Musculoskeletal: No digital cyanosis, clubbing  Skin: No rashes  Psych: Normal affect and demeanor, alert and oriented x3    Data Reviewed:  I have personally reviewed following labs and imaging studies  Micro Results Recent Results (from the past 240 hour(s))  Blood Culture (routine x 2)     Status: None (Preliminary result)   Collection Time: 11/07/19  3:17 PM   Specimen: BLOOD  Result Value Ref Range Status   Specimen Description BLOOD RIGHT ANTECUBITAL  Final   Special Requests   Final    BOTTLES DRAWN AEROBIC AND ANAEROBIC Blood Culture adequate volume Performed at [facility], [address] [place], [place] [address]    Culture NO GROWTH < 12 HOURS  Final   Report Status PENDING  Incomplete  Blood Culture (routine x 2)     Status: None  (Preliminary result)   Collection Time: 11/07/19  3:28 PM   Specimen: BLOOD LEFT ARM  Result Value Ref Range Status   Specimen Description BLOOD LEFT ARM  Final   Special Requests   Final    BOTTLES DRAWN AEROBIC AND ANAEROBIC Blood Culture results may not be optimal due to an inadequate volume of blood received in culture bottles Performed  at [facility], [address] [place], [place] [address]    Culture NO GROWTH < 12 HOURS  Final   Report Status PENDING  Incomplete    Radiology Reports CT Angio Chest PE W/Cm &/Or Wo Cm  Result Date: 11/07/2019 CLINICAL DATA:  PE suspected. Fever and shortness of breath. Recent COVID diagnosis. EXAM: CT ANGIOGRAPHY CHEST WITH CONTRAST TECHNIQUE: Multidetector CT imaging of the chest was performed using the standard protocol during bolus administration of intravenous contrast. Multiplanar CT image reconstructions and MIPs were obtained to evaluate the vascular anatomy. CONTRAST:  OMNIPAQUE IOHEXOL 350 MG/ML SOLN COMPARISON:  None. FINDINGS: Cardiovascular: Contrast injection is sufficient to demonstrate satisfactory opacification of the pulmonary arteries to the segmental level. There is no pulmonary embolus. The main pulmonary artery is within normal limits for size. There is no CT evidence of acute right heart strain. The visualized aorta is normal. Heart size is normal, without pericardial effusion. Mediastinum/Nodes: --No mediastinal or hilar lymphadenopathy. --No axillary lymphadenopathy. --No supraclavicular lymphadenopathy. --Normal thyroid gland. --The esophagus is unremarkable Lungs/Pleura: There are ground-glass airspace opacities in the lower lobes bilaterally, left worse than right. There is no pneumothorax or significant pleural effusion. The trachea is unremarkable. Upper Abdomen: No acute abnormality. Musculoskeletal: No chest wall abnormality. No acute or significant osseous findings. Review of the MIP images confirms the above  findings. IMPRESSION: 1. No evidence of pulmonary embolus. 2. Bilateral ground-glass airspace opacities in the lower lobes, left worse than right, which are concerning for an atypical infectious process in the appropriate clinical setting. Electronically Signed   By: Katherine Mantle M.D.   On: 11/07/2019 16:52   DG Chest Portable 1 View  Result Date: 11/07/2019 CLINICAL DATA:  Positive COVID-19 test 5 days ago, worsening shortness of breath and cough, previous tobacco abuse EXAM: PORTABLE CHEST 1 VIEW COMPARISON:  None. FINDINGS: The heart size and mediastinal contours are within normal limits. Both lungs are clear. The visualized skeletal structures are unremarkable. IMPRESSION: No active disease. Electronically Signed   By: Sharlet [place] M.D.   On: 11/07/2019 15:11    Lab Data:  CBC: Recent Labs  Lab 11/07/19 1517 11/08/19 0434  WBC 4.3 2.9*  NEUTROABS 3.1 2.2  HGB 14.2 13.4  HCT 43.9 41.7  MCV 88.9 88.2  PLT 225 218   Basic Metabolic Panel: Recent Labs  Lab 11/07/19 1517 11/07/19 2115 11/08/19 0434  NA 137  --  139  K 3.3*  --  4.1  CL 101  --  107  CO2 24  --  22  GLUCOSE 100*  --  112*  BUN 9  --  14  CREATININE 0.71  --  0.70  CALCIUM 8.7*  --  9.0  MG  --  2.3  --    GFR: Estimated Creatinine Clearance: 75.3 mL/min (by C-G formula based on SCr of 0.7 mg/dL). Liver Function Tests: Recent Labs  Lab 11/07/19 1517 11/08/19 0434  AST 20 21  ALT 15 16  ALKPHOS 59 56  BILITOT 0.5 0.5  PROT 7.2 6.7  ALBUMIN 4.1 3.7   No results for input(s): LIPASE, AMYLASE in the last 168 hours. No results for input(s): AMMONIA in the last 168 hours. Coagulation Profile: No results for input(s): INR, PROTIME in the last 168 hours. Cardiac Enzymes: No results for input(s): CKTOTAL, CKMB, CKMBINDEX, TROPONINI in the last 168 hours. BNP (last 3 results) No results for input(s): PROBNP in the last 8760 hours. HbA1C: No results for  input(s): HGBA1C in the last 72  hours. CBG: No results for input(s): GLUCAP in the last 168 hours. Lipid Profile: Recent Labs    11/07/19 1517  TRIG 106   Thyroid Function Tests: No results for input(s): TSH, T4TOTAL, FREET4, T3FREE, THYROIDAB in the last 72 hours. Anemia Panel: Recent Labs    11/07/19 1517  FERRITIN 54   Urine analysis: No results found for: COLORURINE, APPEARANCEUR, LABSPEC, PHURINE, GLUCOSEU, HGBUR, BILIRUBINUR, KETONESUR, PROTEINUR, UROBILINOGEN, NITRITE, LEUKOCYTESUR   Alma Muegge M.D. [facility] 11/08/2019, 11:06 AM   Call night coverage person covering after 7pm

## 2019-11-09 DIAGNOSIS — E876 Hypokalemia: Secondary | ICD-10-CM

## 2019-11-09 LAB — COMPREHENSIVE METABOLIC PANEL
ALT: 18 U/L (ref 0–44)
AST: 21 U/L (ref 15–41)
Albumin: 3.6 g/dL (ref 3.5–5.0)
Alkaline Phosphatase: 51 U/L (ref 38–126)
Anion gap: 10 (ref 5–15)
BUN: 14 mg/dL (ref 6–20)
CO2: 22 mmol/L (ref 22–32)
Calcium: 8.9 mg/dL (ref 8.9–10.3)
Chloride: 105 mmol/L (ref 98–111)
Creatinine, Ser: 0.68 mg/dL (ref 0.44–1.00)
GFR calc Af Amer: >60 mL/min (ref >60)
GFR calc non Af Amer: >60 mL/min (ref >60)
Glucose, Bld: 105 mg/dL — ABNORMAL HIGH (ref 70–99)
Potassium: 4.2 mmol/L (ref 3.5–5.1)
Sodium: 137 mmol/L (ref 135–145)
Total Bilirubin: 0.5 mg/dL (ref 0.3–1.2)
Total Protein: 6.1 g/dL — ABNORMAL LOW (ref 6.5–8.1)

## 2019-11-09 LAB — C-REACTIVE PROTEIN: CRP: 0.6 mg/dL (ref <1.0)

## 2019-11-09 LAB — GI PATHOGEN PANEL BY PCR, STOOL

## 2019-11-09 LAB — CBC WITH DIFFERENTIAL/PLATELET
Abs Immature Granulocytes: 0.01 10*3/uL (ref 0.00–0.07)
Basophils Absolute: 0 10*3/uL (ref 0.0–0.1)
Basophils Relative: 0 %
Eosinophils Absolute: 0 10*3/uL (ref 0.0–0.5)
Eosinophils Relative: 0 %
HCT: 41.4 % (ref 36.0–46.0)
Hemoglobin: 13.3 g/dL (ref 12.0–15.0)
Immature Granulocytes: 0 %
Lymphocytes Relative: 25 %
Lymphs Abs: 1.4 10*3/uL (ref 0.7–4.0)
MCH: 28.8 pg (ref 26.0–34.0)
MCHC: 32.1 g/dL (ref 30.0–36.0)
MCV: 89.6 fL (ref 80.0–100.0)
Monocytes Absolute: 0.3 10*3/uL (ref 0.1–1.0)
Monocytes Relative: 6 %
Neutro Abs: 3.7 10*3/uL (ref 1.7–7.7)
Neutrophils Relative %: 69 %
Platelets: 215 10*3/uL (ref 150–400)
RBC: 4.62 MIL/uL (ref 3.87–5.11)
RDW: 13.4 % (ref 11.5–15.5)
WBC: 5.4 10*3/uL (ref 4.0–10.5)
nRBC: 0 % (ref 0.0–0.2)

## 2019-11-09 LAB — LACTATE DEHYDROGENASE: LDH: 179 U/L (ref 98–192)

## 2019-11-09 LAB — D-DIMER, QUANTITATIVE: D-Dimer, Quant: 0.39 ug/mL-FEU (ref 0.00–0.50)

## 2019-11-09 NOTE — Progress Notes (Signed)
[facility]                                                                              Patient Demographics  Heide Brossart, is a 49 y.o. female, DOB - [birth year], WUJ:[id number]  Admit date - 11/07/2019   Admitting Physician John Giovanni, MD  Outpatient Primary MD for the patient is Brooke Bonito, MD  Outpatient specialists:   LOS - 2  days   Medical records reviewed and are as summarized below:    Chief Complaint  Patient presents with  . Cough    COVID+       Brief summary   Patient is a 49 year old female with anxiety, asthma, GERD presented to ED with complaints of shortness of breath, coughing.  She had tested positive for COVID-19 on 11/03/2019.  Reported coughing, shortness of breath, body aches, diarrhea.  Reported fever has improved in the last 24 hours PTA. COVID-19 positive Whole family has COVID-19 including her husband and kids  In ED, was noted to be hypoxic with minimal exertion, O2 sats dropped into 80s and heart rate worsened to 110s.  CT angiogram negative for PE but showed bibasilar groundglass opacities in lower lobes left worse than right  Assessment & Plan    Principal Problem:  Acute hypoxic respiratory failure due to acute COVID-19 viral pneumonia during the ongoing COVID-19 pandemic- POA - Patient presented with hypoxia with exertion, body aches diarrhea, shortness of breath and coughing.  CTA negative for PE but showed bibasilar groundglass opacities in both lobes. -At rest O2 sats in high 90s, however per patient dropped down to low 80s on ambulation. -Continue IV Decadron, remdesivir, day #3 today - Continue Supportive care: vitamin C/zinc, albuterol, Tylenol, Tessalon Perles, Tussionex for cough.  Cough is improving today - Continue to wean oxygen, ambulatory O2 screening daily as tolerated  - Oxygen - SpO2: 100 % - Continue to follow labs as below  No results found for: SARSCOV2NAA   Recent Labs  Lab  11/07/19 1517 11/08/19 0434 11/09/19 0426  DDIMER 0.64* 0.45 0.39  FERRITIN 54  --   --   CRP 0.5 0.8 0.6  ALT 15 16 18   PROCALCITON <0.10  --   --     Active Problems:    Asthma Currently stable, no wheezing, continue albuterol inhaler as needed    Diarrhea Secondary to COVID-19, states diarrhea is now improving    Hypokalemia Resolved   Code Status: Full CODE STATUS DVT Prophylaxis:  Lovenox  Family Communication: Discussed all imaging results, lab results, explained to the patient.  Patient stated that she will call her husband and update him     Disposition Plan: Patient from home, anticipate discharge home once remdesivir is completed, day #3 today   Time Spent in minutes 35 minutes  Procedures:  None  Consultants:   None  Antimicrobials:   Anti-infectives (From admission, onward)   Start     Dose/Rate Route Frequency Ordered Stop   11/08/19 1000  remdesivir 100 mg in sodium chloride 0.9 % 100 mL IVPB     100 mg 200 mL/hr over 30 Minutes Intravenous Daily 11/07/19  1726 11/12/19 0959   11/07/19 1800  remdesivir 100 mg in sodium chloride 0.9 % 100 mL IVPB     100 mg 200 mL/hr over 30 Minutes Intravenous  Once 11/07/19 1726 11/07/19 1923   11/07/19 1730  remdesivir 100 mg in sodium chloride 0.9 % 100 mL IVPB     100 mg 200 mL/hr over 30 Minutes Intravenous Once 11/07/19 1726 11/07/19 1852         Medications  Scheduled Meds: . vitamin C  500 mg Oral Daily  . benzonatate  200 mg Oral TID  . chlorpheniramine-HYDROcodone  5 mL Oral Q12H  . dexamethasone (DECADRON) injection  6 mg Intravenous Daily  . enoxaparin (LOVENOX) injection  40 mg Subcutaneous Q24H  . pneumococcal 23 valent vaccine  0.5 mL Intramuscular Tomorrow-1000  . zinc sulfate  220 mg Oral Daily   Continuous Infusions: . remdesivir 100 mg in NS 100 mL 100 mg (11/09/19 0930)   PRN Meds:.acetaminophen, albuterol, guaiFENesin-dextromethorphan, loperamide      Subjective:    Darlin Stenseth was seen and examined today.  Cough is much better, still desats to low 80s on ambulation.  At rest, O2 sats remained above 93.  No nausea vomiting or abdominal pain.  No fevers.  Diarrhea is improving.  Patient denies dizziness, abdominal pain, N/V/, new weakness, numbess, tingling.  No acute events overnight.  Objective:   Vitals:   11/09/19 1000 11/09/19 1100 11/09/19 1243 11/09/19 1500  BP:   (!) 100/48   Pulse:   75   Resp: 19 20  19   Temp:   98 F (36.7 C)   TempSrc:   Oral   SpO2:   100%   Weight:      Height:        Intake/Output Summary (Last 24 hours) at 11/09/2019 1517 Last data filed at 11/09/2019 1500 Gross per 24 hour  Intake 920 ml  Output 2150 ml  Net -1230 ml     Wt Readings from Last 3 Encounters:  11/07/19 63.2 kg  08/10/15 60.3 kg  08/02/13 56.7 kg    Physical Exam  General: Alert and oriented x 3, NAD, coughing  Cardiovascular: S1 S2 clear, RRR. No pedal edema b/l  Respiratory: CTAB, no wheezing, rales or rhonchi  Gastrointestinal: Soft, nontender, nondistended, NBS  Ext: no pedal edema bilaterally  Neuro: no new deficits  Musculoskeletal: No cyanosis, clubbing  Skin: No rashes  Psych: Normal affect and demeanor, alert and oriented x3      Data Reviewed:  I have personally reviewed following labs and imaging studies  Micro Results Recent Results (from the past 240 hour(s))  Blood Culture (routine x 2)     Status: None (Preliminary result)   Collection Time: 11/07/19  3:17 PM   Specimen: BLOOD  Result Value Ref Range Status   Specimen Description BLOOD RIGHT ANTECUBITAL  Final   Special Requests   Final    BOTTLES DRAWN AEROBIC AND ANAEROBIC Blood Culture adequate volume Performed at [facility], [address] [place], [place]    Culture   Final    NO GROWTH 2 DAYS Performed at [facility], [address] [place], [place]    Report Status PENDING  Incomplete  Blood  Culture (routine x 2)     Status: None (Preliminary result)   Collection Time: 11/07/19  3:28 PM   Specimen: BLOOD LEFT ARM  Result Value Ref Range Status   Specimen Description  BLOOD LEFT ARM  Final   Special Requests   Final    BOTTLES DRAWN AEROBIC AND ANAEROBIC Blood Culture results may not be optimal due to an inadequate volume of blood received in culture bottles Performed at [facility], [address], [place], [place] [address]    Culture   Final    NO GROWTH 2 DAYS Performed at [facility], [place] [address] [place], [place] [address]    Report Status PENDING  Incomplete  GI pathogen panel by PCR, stool     Status: None   Collection Time: 11/08/19  4:32 PM   Specimen: Stool  Result Value Ref Range Status   Plesiomonas shigelloides NOT DETECTED NOT DETECTED Final   Yersinia enterocolitica NOT DETECTED NOT DETECTED Final   Vibrio NOT DETECTED NOT DETECTED Final   Enteropathogenic E coli NOT DETECTED NOT DETECTED Final   E coli (ETEC) LT/ST NOT DETECTED NOT DETECTED Final   E coli 6195 by PCR Not applicable NOT DETECTED Final   Cryptosporidium by PCR NOT DETECTED NOT DETECTED Final   Entamoeba histolytica NOT DETECTED NOT DETECTED Final   Adenovirus F 40/41 NOT DETECTED NOT DETECTED Final   Norovirus GI/GII NOT DETECTED NOT DETECTED Final   Sapovirus NOT DETECTED NOT DETECTED Final    Comment: (NOTE) Performed At: [facility] [place], [place] [id number] Rush Farmer MD PY:[id number]    Vibrio cholerae NOT DETECTED NOT DETECTED Final   Campylobacter by PCR NOT DETECTED NOT DETECTED Final   Salmonella by PCR NOT DETECTED NOT DETECTED Final   E coli (STEC) NOT DETECTED NOT DETECTED Final   Enteroaggregative E coli NOT DETECTED NOT DETECTED Final   Shigella by PCR NOT DETECTED NOT DETECTED Final   Cyclospora cayetanensis NOT DETECTED NOT DETECTED Final   Astrovirus NOT DETECTED NOT DETECTED Final   G lamblia by PCR NOT DETECTED  NOT DETECTED Final   Rotavirus A by PCR NOT DETECTED NOT DETECTED Final    Radiology Reports CT Angio Chest PE W/Cm &/Or Wo Cm  Result Date: 11/07/2019 CLINICAL DATA:  PE suspected. Fever and shortness of breath. Recent COVID diagnosis. EXAM: CT ANGIOGRAPHY CHEST WITH CONTRAST TECHNIQUE: Multidetector CT imaging of the chest was performed using the standard protocol during bolus administration of intravenous contrast. Multiplanar CT image reconstructions and MIPs were obtained to evaluate the vascular anatomy. CONTRAST:  129mL OMNIPAQUE IOHEXOL 350 MG/ML SOLN COMPARISON:  None. FINDINGS: Cardiovascular: Contrast injection is sufficient to demonstrate satisfactory opacification of the pulmonary arteries to the segmental level. There is no pulmonary embolus. The main pulmonary artery is within normal limits for size. There is no CT evidence of acute right heart strain. The visualized aorta is normal. Heart size is normal, without pericardial effusion. Mediastinum/Nodes: --No mediastinal or hilar lymphadenopathy. --No axillary lymphadenopathy. --No supraclavicular lymphadenopathy. --Normal thyroid gland. --The esophagus is unremarkable Lungs/Pleura: There are ground-glass airspace opacities in the lower lobes bilaterally, left worse than right. There is no pneumothorax or significant pleural effusion. The trachea is unremarkable. Upper Abdomen: No acute abnormality. Musculoskeletal: No chest wall abnormality. No acute or significant osseous findings. Review of the MIP images confirms the above findings. IMPRESSION: 1. No evidence of pulmonary embolus. 2. Bilateral ground-glass airspace opacities in the lower lobes, left worse than right, which are concerning for an atypical infectious process in the appropriate clinical setting. Electronically Signed   By: Constance Holster M.D.   On: 11/07/2019 16:52   DG Chest Portable  1 View  Result Date: 11/07/2019 CLINICAL DATA:  Positive COVID-19 test 5 days ago,  worsening shortness of breath and cough, previous tobacco abuse EXAM: PORTABLE CHEST 1 VIEW COMPARISON:  None. FINDINGS: The heart size and mediastinal contours are within normal limits. Both lungs are clear. The visualized skeletal structures are unremarkable. IMPRESSION: No active disease. Electronically Signed   By: Sharlet [place] M.D.   On: 11/07/2019 15:11    Lab Data:  CBC: Recent Labs  Lab 11/07/19 1517 11/08/19 0434 11/09/19 0426  WBC 4.3 2.9* 5.4  NEUTROABS 3.1 2.2 3.7  HGB 14.2 13.4 13.3  HCT 43.9 41.7 41.4  MCV 88.9 88.2 89.6  PLT 225 218 215   Basic Metabolic Panel: Recent Labs  Lab 11/07/19 1517 11/07/19 2115 11/08/19 0434 11/09/19 0426  NA 137  --  139 137  K 3.3*  --  4.1 4.2  CL 101  --  107 105  CO2 24  --  22 22  GLUCOSE 100*  --  112* 105*  BUN 9  --  14 14  CREATININE 0.71  --  0.70 0.68  CALCIUM 8.7*  --  9.0 8.9  MG  --  2.3  --   --    GFR: Estimated Creatinine Clearance: 75.3 mL/min (by C-G formula based on SCr of 0.68 mg/dL). Liver Function Tests: Recent Labs  Lab 11/07/19 1517 11/08/19 0434 11/09/19 0426  AST 20 21 21   ALT 15 16 18   ALKPHOS 59 56 51  BILITOT 0.5 0.5 0.5  PROT 7.2 6.7 6.1*  ALBUMIN 4.1 3.7 3.6   No results for input(s): LIPASE, AMYLASE in the last 168 hours. No results for input(s): AMMONIA in the last 168 hours. Coagulation Profile: No results for input(s): INR, PROTIME in the last 168 hours. Cardiac Enzymes: No results for input(s): CKTOTAL, CKMB, CKMBINDEX, TROPONINI in the last 168 hours. BNP (last 3 results) No results for input(s): PROBNP in the last 8760 hours. HbA1C: No results for input(s): HGBA1C in the last 72 hours. CBG: No results for input(s): GLUCAP in the last 168 hours. Lipid Profile: Recent Labs    11/07/19 1517  TRIG 106   Thyroid Function Tests: No results for input(s): TSH, T4TOTAL, FREET4, T3FREE, THYROIDAB in the last 72 hours. Anemia Panel: Recent Labs    11/07/19 1517  FERRITIN  54   Urine analysis: No results found for: COLORURINE, APPEARANCEUR, LABSPEC, PHURINE, GLUCOSEU, HGBUR, BILIRUBINUR, KETONESUR, PROTEINUR, UROBILINOGEN, NITRITE, LEUKOCYTESUR   Ignacia Gentzler M.D. [facility] 11/09/2019, 3:17 PM   Call night coverage person covering after 7pm

## 2019-11-09 NOTE — Plan of Care (Signed)
  Problem: Activity: Goal: Risk for activity intolerance will decrease Outcome: Progressing   Problem: Nutrition: Goal: Adequate nutrition will be maintained Outcome: Progressing   

## 2019-11-10 DIAGNOSIS — R197 Diarrhea, unspecified: Secondary | ICD-10-CM

## 2019-11-10 LAB — COMPREHENSIVE METABOLIC PANEL
ALT: 27 U/L (ref 0–44)
AST: 24 U/L (ref 15–41)
Albumin: 3.4 g/dL — ABNORMAL LOW (ref 3.5–5.0)
Alkaline Phosphatase: 51 U/L (ref 38–126)
Anion gap: 8 (ref 5–15)
BUN: 12 mg/dL (ref 6–20)
CO2: 24 mmol/L (ref 22–32)
Calcium: 8.7 mg/dL — ABNORMAL LOW (ref 8.9–10.3)
Chloride: 104 mmol/L (ref 98–111)
Creatinine, Ser: 0.57 mg/dL (ref 0.44–1.00)
GFR calc Af Amer: >60 mL/min (ref >60)
GFR calc non Af Amer: >60 mL/min (ref >60)
Glucose, Bld: 125 mg/dL — ABNORMAL HIGH (ref 70–99)
Potassium: 4.6 mmol/L (ref 3.5–5.1)
Sodium: 136 mmol/L (ref 135–145)
Total Bilirubin: 0.3 mg/dL (ref 0.3–1.2)
Total Protein: 6.3 g/dL — ABNORMAL LOW (ref 6.5–8.1)

## 2019-11-10 LAB — LACTATE DEHYDROGENASE: LDH: 185 U/L (ref 98–192)

## 2019-11-10 LAB — CBC WITH DIFFERENTIAL/PLATELET
Abs Immature Granulocytes: 0.03 10*3/uL (ref 0.00–0.07)
Basophils Absolute: 0 10*3/uL (ref 0.0–0.1)
Basophils Relative: 0 %
Eosinophils Absolute: 0 10*3/uL (ref 0.0–0.5)
Eosinophils Relative: 0 %
HCT: 41.9 % (ref 36.0–46.0)
Hemoglobin: 13.2 g/dL (ref 12.0–15.0)
Immature Granulocytes: 1 %
Lymphocytes Relative: 18 %
Lymphs Abs: 1 10*3/uL (ref 0.7–4.0)
MCH: 28.3 pg (ref 26.0–34.0)
MCHC: 31.5 g/dL (ref 30.0–36.0)
MCV: 89.9 fL (ref 80.0–100.0)
Monocytes Absolute: 0.3 10*3/uL (ref 0.1–1.0)
Monocytes Relative: 6 %
Neutro Abs: 4 10*3/uL (ref 1.7–7.7)
Neutrophils Relative %: 75 %
Platelets: 263 10*3/uL (ref 150–400)
RBC: 4.66 MIL/uL (ref 3.87–5.11)
RDW: 13.3 % (ref 11.5–15.5)
WBC: 5.3 10*3/uL (ref 4.0–10.5)
nRBC: 0 % (ref 0.0–0.2)

## 2019-11-10 LAB — D-DIMER, QUANTITATIVE: D-Dimer, Quant: 0.36 ug/mL-FEU (ref 0.00–0.50)

## 2019-11-10 LAB — C-REACTIVE PROTEIN: CRP: 0.6 mg/dL (ref <1.0)

## 2019-11-10 MED ORDER — MOMETASONE FURO-FORMOTEROL FUM 100-5 MCG/ACT IN AERO
2.0000 | INHALATION_SPRAY | Freq: Two times a day (BID) | RESPIRATORY_TRACT | Status: DC
  Administered 2019-11-10 – 2019-11-11 (×2): 2 via RESPIRATORY_TRACT
  Filled 2019-11-10: qty 8.8

## 2019-11-10 MED ORDER — IPRATROPIUM-ALBUTEROL 20-100 MCG/ACT IN AERS
2.0000 | INHALATION_SPRAY | Freq: Four times a day (QID) | RESPIRATORY_TRACT | Status: DC
  Administered 2019-11-10 – 2019-11-11 (×3): 2 via RESPIRATORY_TRACT
  Filled 2019-11-10: qty 4

## 2019-11-10 MED ORDER — FAMOTIDINE 20 MG PO TABS
20.0000 mg | ORAL_TABLET | Freq: Every day | ORAL | Status: DC
  Administered 2019-11-10 – 2019-11-11 (×2): 20 mg via ORAL
  Filled 2019-11-10 (×2): qty 1

## 2019-11-10 NOTE — Plan of Care (Signed)

## 2019-11-10 NOTE — Progress Notes (Signed)
PROGRESS NOTE    Julie [name]  TGY:[id number] DOB: March 31, 1971 DOA: 11/07/2019 PCP: Reita Cliche, MD    Brief Narrative:  Patient is a 49 year old female with anxiety, asthma, GERD presented to ED with complaints of shortness of breath, coughing.  She had tested positive for COVID-19 on 11/03/2019.  Reported coughing, shortness of breath, body aches, diarrhea.  Reported fever has improved in the last 24 hours PTA. COVID-19 positive Whole family has COVID-19 including her husband and kids  In ED, was noted to be hypoxic with minimal exertion, O2 sats dropped into 80s and heart rate worsened to 110s.  CT angiogram negative for PE but showed bibasilar groundglass opacities in lower lobes left worse than right    Assessment & Plan:   Principal Problem:   Pneumonia due to COVID-19 virus Active Problems:   Acute respiratory failure with hypoxia (HCC)   Asthma   Diarrhea   Hypokalemia  1 acute hypoxic respiratory failure secondary to acute COVID-19 viral pneumonia, POA Patient presented with hypoxia on exertion, body aches, diarrhea, shortness of breath with cough.  CT angiogram chest negative for PE but showed bibasilar groundglass opacities in both lobes.  Patient noted to have O2 sats at rest on room air in the high 90s however sats noted to drop in the low 80s on ambulation.  Ambulatory sats pending for this morning.  Continue IV Decadron, remdesivir day #4/5, vitamin C, zinc, albuterol inhaler, Tessalon Perles, Tylenol, Tussionex.  Continue to wean O2.  Repeat ambulatory sats tomorrow.  2.  Asthma Stable.  Albuterol inhaler as needed.  3.  Diarrhea Secondary to problem #1.  Improved.  4.  Hypokalemia Repleted.   DVT prophylaxis: Lovenox Code Status: Full Family Communication: Updated patient.  No family at bedside. Disposition Plan:  . Patient came from: Home            . Anticipated d/c place: Home . Barriers to d/c OR conditions which need to be met to effect a safe  d/c: Home tomorrow if hypoxic respiratory failure continues to improve with improvement with ambulatory sats and completion of IV remdesivir.   Consultants:   None  Procedures:   CT angiogram chest 11/07/2019  Chest x-ray 11/07/2019  Antimicrobials:   None   Subjective: Patient sitting up in bed eating lunch.  States shortness of breath has improved significantly after being started on steroids.  Feeling better.  Asking when she is going to be able to go home.  Patient states she has been ambulating in the room with improvement with O2 sats.  Objective: Vitals:   11/09/19 1500 11/09/19 2033 11/09/19 2134 11/10/19 0540  BP:   111/60 106/67  Pulse:   67 (!) 59  Resp: 19  17 17   Temp:   98.3 F (36.8 C) 98.4 F (36.9 C)  TempSrc:   Oral Oral  SpO2:  98% 100% 100%  Weight:      Height:        Intake/Output Summary (Last 24 hours) at 11/10/2019 1309 Last data filed at 11/10/2019 1030 Gross per 24 hour  Intake 820 ml  Output --  Net 820 ml   Filed Weights   11/07/19 1326 11/07/19 2013  Weight: 62.6 kg 63.2 kg    Examination:  General exam: Appears calm and comfortable  Respiratory system: Clear to auscultation. Respiratory effort normal. Cardiovascular system: S1 & S2 heard, RRR. No JVD, murmurs, rubs, gallops or clicks. No pedal edema. Gastrointestinal system: Abdomen is nondistended, soft and nontender.  No organomegaly or masses felt. Normal bowel sounds heard. Central nervous system: Alert and oriented. No focal neurological deficits. Extremities: Symmetric 5 x 5 power. Skin: No rashes, lesions or ulcers Psychiatry: Judgement and insight appear normal. Mood & affect appropriate.     Data Reviewed: I have personally reviewed following labs and imaging studies  CBC: Recent Labs  Lab 11/07/19 1517 11/08/19 0434 11/09/19 0426 11/10/19 0342  WBC 4.3 2.9* 5.4 5.3  NEUTROABS 3.1 2.2 3.7 4.0  HGB 14.2 13.4 13.3 13.2  HCT 43.9 41.7 41.4 41.9  MCV 88.9 88.2  89.6 89.9  PLT 225 218 215 173   Basic Metabolic Panel: Recent Labs  Lab 11/07/19 1517 11/07/19 2115 11/08/19 0434 11/09/19 0426 11/10/19 0342  NA 137  --  139 137 136  K 3.3*  --  4.1 4.2 4.6  CL 101  --  107 105 104  CO2 24  --  22 22 24   GLUCOSE 100*  --  112* 105* 125*  BUN 9  --  14 14 12   CREATININE 0.71  --  0.70 0.68 0.57  CALCIUM 8.7*  --  9.0 8.9 8.7*  MG  --  2.3  --   --   --    GFR: Estimated Creatinine Clearance: 75.3 mL/min (by C-G formula based on SCr of 0.57 mg/dL). Liver Function Tests: Recent Labs  Lab 11/07/19 1517 11/08/19 0434 11/09/19 0426 11/10/19 0342  AST 20 21 21 24   ALT 15 16 18 27   ALKPHOS 59 56 51 51  BILITOT 0.5 0.5 0.5 0.3  PROT 7.2 6.7 6.1* 6.3*  ALBUMIN 4.1 3.7 3.6 3.4*   No results for input(s): LIPASE, AMYLASE in the last 168 hours. No results for input(s): AMMONIA in the last 168 hours. Coagulation Profile: No results for input(s): INR, PROTIME in the last 168 hours. Cardiac Enzymes: No results for input(s): CKTOTAL, CKMB, CKMBINDEX, TROPONINI in the last 168 hours. BNP (last 3 results) No results for input(s): PROBNP in the last 8760 hours. HbA1C: No results for input(s): HGBA1C in the last 72 hours. CBG: No results for input(s): GLUCAP in the last 168 hours. Lipid Profile: Recent Labs    11/07/19 1517  TRIG 106   Thyroid Function Tests: No results for input(s): TSH, T4TOTAL, FREET4, T3FREE, THYROIDAB in the last 72 hours. Anemia Panel: Recent Labs    11/07/19 1517  FERRITIN 54   Sepsis Labs: Recent Labs  Lab 11/07/19 1517  PROCALCITON <0.10  LATICACIDVEN 1.2    Recent Results (from the past 240 hour(s))  Blood Culture (routine x 2)     Status: None (Preliminary result)   Collection Time: 11/07/19  3:17 PM   Specimen: BLOOD  Result Value Ref Range Status   Specimen Description BLOOD RIGHT ANTECUBITAL  Final   Special Requests   Final    BOTTLES DRAWN AEROBIC AND ANAEROBIC Blood Culture adequate  volume Performed at [facility], [address] [place], [place] [address]    Culture   Final    NO GROWTH 3 DAYS Performed at [facility], [place] [address] [place], [place] [address]    Report Status PENDING  Incomplete  Blood Culture (routine x 2)     Status: None (Preliminary result)   Collection Time: 11/07/19  3:28 PM   Specimen: BLOOD LEFT ARM  Result Value Ref Range Status   Specimen Description BLOOD LEFT ARM  Final   Special Requests   Final  BOTTLES DRAWN AEROBIC AND ANAEROBIC Blood Culture results may not be optimal due to an inadequate volume of blood received in culture bottles Performed at [facility], [address] [place], [place] [address]    Culture   Final    NO GROWTH 3 DAYS Performed at [facility], [place] [address] [place],  [address]    Report Status PENDING  Incomplete  GI pathogen panel by PCR, stool     Status: None   Collection Time: 11/08/19  4:32 PM   Specimen: Stool  Result Value Ref Range Status   Plesiomonas shigelloides NOT DETECTED NOT DETECTED Final   Yersinia enterocolitica NOT DETECTED NOT DETECTED Final   Vibrio NOT DETECTED NOT DETECTED Final   Enteropathogenic E coli NOT DETECTED NOT DETECTED Final   E coli (ETEC) LT/ST NOT DETECTED NOT DETECTED Final   E coli 8413 by PCR Not applicable NOT DETECTED Final   Cryptosporidium by PCR NOT DETECTED NOT DETECTED Final   Entamoeba histolytica NOT DETECTED NOT DETECTED Final   Adenovirus F 40/41 NOT DETECTED NOT DETECTED Final   Norovirus GI/GII NOT DETECTED NOT DETECTED Final   Sapovirus NOT DETECTED NOT DETECTED Final    Comment: (NOTE) Performed At: [facility] [place], [place] [id number] Rush Farmer MD ZD:[id number]    Vibrio cholerae NOT DETECTED NOT DETECTED Final   Campylobacter by PCR NOT DETECTED NOT DETECTED Final   Salmonella by PCR NOT DETECTED NOT DETECTED Final   E coli (STEC) NOT DETECTED NOT DETECTED  Final   Enteroaggregative E coli NOT DETECTED NOT DETECTED Final   Shigella by PCR NOT DETECTED NOT DETECTED Final   Cyclospora cayetanensis NOT DETECTED NOT DETECTED Final   Astrovirus NOT DETECTED NOT DETECTED Final   G lamblia by PCR NOT DETECTED NOT DETECTED Final   Rotavirus A by PCR NOT DETECTED NOT DETECTED Final         Radiology Studies: No results found.      Scheduled Meds: . vitamin C  500 mg Oral Daily  . benzonatate  200 mg Oral TID  . chlorpheniramine-HYDROcodone  5 mL Oral Q12H  . dexamethasone (DECADRON) injection  6 mg Intravenous Daily  . enoxaparin (LOVENOX) injection  40 mg Subcutaneous Q24H  . famotidine  20 mg Oral Daily  . Ipratropium-Albuterol  2 puff Inhalation Q6H  . mometasone-formoterol  2 puff Inhalation BID  . pneumococcal 23 valent vaccine  0.5 mL Intramuscular Tomorrow-1000  . zinc sulfate  220 mg Oral Daily   Continuous Infusions: . remdesivir 100 mg in NS 100 mL 100 mg (11/10/19 1226)     LOS: 3 days    Time spent: 35 minutes    Irine Seal, MD [facility]   To contact the attending provider between 7A-7P or the covering provider during after hours 7P-7A, please log into the web site [email or web address] and access using universal [place] password for that web site. If you do not have the password, please call the hospital operator.  11/10/2019, 1:09 PM

## 2019-11-10 NOTE — IPOC Note (Signed)
During the past hour - telemetry box has decided no to pick up sensory through WiFi- battery has been changed and that has not cleared the problem. Changed monitoring box to a substitute  To #[id number]

## 2019-11-11 LAB — CBC WITH DIFFERENTIAL/PLATELET
Abs Immature Granulocytes: 0.03 10*3/uL (ref 0.00–0.07)
Basophils Absolute: 0 10*3/uL (ref 0.0–0.1)
Basophils Relative: 0 %
Eosinophils Absolute: 0 10*3/uL (ref 0.0–0.5)
Eosinophils Relative: 0 %
HCT: 39.6 % (ref 36.0–46.0)
Hemoglobin: 12.5 g/dL (ref 12.0–15.0)
Immature Granulocytes: 1 %
Lymphocytes Relative: 11 %
Lymphs Abs: 0.7 10*3/uL (ref 0.7–4.0)
MCH: 28.2 pg (ref 26.0–34.0)
MCHC: 31.6 g/dL (ref 30.0–36.0)
MCV: 89.4 fL (ref 80.0–100.0)
Monocytes Absolute: 0.3 10*3/uL (ref 0.1–1.0)
Monocytes Relative: 5 %
Neutro Abs: 5.2 10*3/uL (ref 1.7–7.7)
Neutrophils Relative %: 83 %
Platelets: 254 10*3/uL (ref 150–400)
RBC: 4.43 MIL/uL (ref 3.87–5.11)
RDW: 13.2 % (ref 11.5–15.5)
WBC: 6.2 10*3/uL (ref 4.0–10.5)
nRBC: 0 % (ref 0.0–0.2)

## 2019-11-11 LAB — COMPREHENSIVE METABOLIC PANEL
ALT: 23 U/L (ref 0–44)
AST: 18 U/L (ref 15–41)
Albumin: 3.4 g/dL — ABNORMAL LOW (ref 3.5–5.0)
Alkaline Phosphatase: 51 U/L (ref 38–126)
Anion gap: 7 (ref 5–15)
BUN: 13 mg/dL (ref 6–20)
CO2: 24 mmol/L (ref 22–32)
Calcium: 8.5 mg/dL — ABNORMAL LOW (ref 8.9–10.3)
Chloride: 104 mmol/L (ref 98–111)
Creatinine, Ser: 0.67 mg/dL (ref 0.44–1.00)
GFR calc Af Amer: >60 mL/min (ref >60)
GFR calc non Af Amer: >60 mL/min (ref >60)
Glucose, Bld: 151 mg/dL — ABNORMAL HIGH (ref 70–99)
Potassium: 4.4 mmol/L (ref 3.5–5.1)
Sodium: 135 mmol/L (ref 135–145)
Total Bilirubin: 0.3 mg/dL (ref 0.3–1.2)
Total Protein: 6 g/dL — ABNORMAL LOW (ref 6.5–8.1)

## 2019-11-11 LAB — LACTATE DEHYDROGENASE: LDH: 188 U/L (ref 98–192)

## 2019-11-11 LAB — D-DIMER, QUANTITATIVE: D-Dimer, Quant: 0.28 ug/mL-FEU (ref 0.00–0.50)

## 2019-11-11 LAB — C-REACTIVE PROTEIN: CRP: 0.8 mg/dL (ref <1.0)

## 2019-11-11 MED ORDER — IPRATROPIUM-ALBUTEROL 20-100 MCG/ACT IN AERS: 2.0000 | INHALATION_SPRAY | Freq: Three times a day (TID) | RESPIRATORY_TRACT | 0 refills | Status: AC

## 2019-11-11 MED ORDER — HYDROCODONE-HOMATROPINE 5-1.5 MG/5ML PO SYRP: 5.0000 mL | ORAL_SOLUTION | Freq: Four times a day (QID) | ORAL | 0 refills | Status: AC | PRN

## 2019-11-11 MED ORDER — BENZONATATE 200 MG PO CAPS: 200.0000 mg | ORAL_CAPSULE | Freq: Three times a day (TID) | ORAL | 0 refills | Status: AC

## 2019-11-11 MED ORDER — ALBUTEROL SULFATE HFA 108 (90 BASE) MCG/ACT IN AERS: 2.0000 | INHALATION_SPRAY | Freq: Four times a day (QID) | RESPIRATORY_TRACT | 0 refills | Status: AC | PRN

## 2019-11-11 MED ORDER — MOMETASONE FURO-FORMOTEROL FUM 100-5 MCG/ACT IN AERO: 2.0000 | INHALATION_SPRAY | Freq: Two times a day (BID) | RESPIRATORY_TRACT | 0 refills | Status: AC

## 2019-11-11 MED ORDER — FAMOTIDINE 20 MG PO TABS: 20.0000 mg | ORAL_TABLET | Freq: Every day | ORAL | 1 refills | Status: AC

## 2019-11-11 MED ORDER — ZINC SULFATE 220 (50 ZN) MG PO CAPS: 220.0000 mg | ORAL_CAPSULE | Freq: Every day | ORAL | Status: AC

## 2019-11-11 MED ORDER — DEXAMETHASONE 4 MG PO TABS
6.0000 mg | ORAL_TABLET | Freq: Every day | ORAL | Status: DC
  Administered 2019-11-11: 6 mg via ORAL
  Filled 2019-11-11: qty 2

## 2019-11-11 MED ORDER — ASCORBIC ACID 500 MG PO TABS: 500.0000 mg | ORAL_TABLET | Freq: Every day | ORAL | Status: AC

## 2019-11-11 MED ORDER — DEXAMETHASONE 6 MG PO TABS: 6.0000 mg | ORAL_TABLET | Freq: Every day | ORAL | 0 refills | Status: AC

## 2019-11-11 NOTE — Discharge Summary (Signed)
Physician Discharge Summary  Julie [name] ZOX:[id number] DOB: 1970/10/10 DOA: 11/07/2019  PCP: Brooke Bonito, MD  Admit date: 11/07/2019 Discharge date: 11/11/2019  Time spent: 50 minutes  Recommendations for Outpatient Follow-up:  1. Follow-up with Brooke Bonito, MD in 3 weeks.  On follow-up patient will need a basic metabolic profile done to follow-up on electrolytes and renal function.   Discharge Diagnoses:  Principal Problem:   Acute respiratory failure with hypoxia (HCC) Active Problems:   Pneumonia due to COVID-19 virus   Asthma   Diarrhea   Hypokalemia   Discharge Condition: Stable and improved  Diet recommendation: Regular  Filed Weights   11/07/19 1326 11/07/19 2013  Weight: 62.6 kg 63.2 kg    History of present illness:  HPI per Dr. Lita Mains is a 49 y.o. female with medical history significant of anxiety, asthma, GERD presented to the ED with complaints of shortness of breath and cough.  Patient tested positive for COVID-19 on 11/03/2019.  Patient stated her symptoms started the day before she was tested for Covid.  She was having cough, shortness of breath, body aches, and diarrhea.  Stated her chest hurts whenever she coughs a lot or takes a deep breath, no chest pain otherwise.  She was previously having fevers but fever stopped 24 hours ago.  Denied nausea, vomiting, or abdominal pain.    ED Course: Afebrile.  Mildly tachypneic but not hypoxic at rest.  Noted to be hypoxic with minimal exertion while walking around the room.  Her oxygen saturation dropped down to 86% and her heart rate went up to the 110s.  Labs showing no leukocytosis.  Lactic acid normal.  Procalcitonin <0.10.  Potassium 3.3.  D-dimer 0.64, LDH 232.  Ferritin, triglycerides, fibrinogen, and CRP normal.  Urine pregnancy test negative.  Blood culture x2 pending. Chest x-ray without evidence of pneumonia. CT angiogram negative for PE but showing bibasilar groundglass airspace  opacities in the lower lobes, left worse than right.  Findings consistent with Covid pneumonia.  Patient was started on Decadron, remdesivir, and Lovenox for DVT prophylaxis.  Hospital Course:  1 acute hypoxic respiratory failure secondary to acute COVID-19 viral pneumonia, POA Patient presented with hypoxia on exertion, body aches, diarrhea, shortness of breath with cough.  CT angiogram chest negative for PE but showed bibasilar groundglass opacities in both lobes.  Patient noted to have O2 sats at rest on room air in the high 90s however sats noted to drop in the low 80s on ambulation.  Patient was placed on IV Decadron, IV remdesivir, vitamin C, zinc, Combivent inhaler, Dulera inhaler, Tessalon Perles, Tylenol and Tussionex.  Patient improved clinically during the hospitalization.  O2 was weaned down and patient noted to have O2 sats of 98% on room air and on ambulation.  Patient completed a full course of IV remdesivir and be discharged home on 5 more days of oral Decadron to complete a 10-day course.  Patient was discharged home in stable and improved condition.  Patient advised to quarantine for 3 weeks from positive Covid test.  Patient will follow up with PCP in the outpatient setting.  2.  Asthma Stable.  Patient was maintained on a Albuterol inhaler as needed.  3.  Diarrhea Secondary to problem #1.  Improved with treatment of #1.  4.  Hypokalemia Repleted.  Procedures:  CT angiogram chest 11/07/2019  Chest x-ray 11/07/2019   Consultations:  None  Discharge Exam: Vitals:   11/11/19 0540 11/11/19 1256  BP: 104/67 112/63  Pulse: 61 81  Resp: 20 16  Temp: 98.1 F (36.7 C) 99.3 F (37.4 C)  SpO2: 99% 99%    General: NAD Cardiovascular: RRR Respiratory: CTA B.  Discharge Instructions   Discharge Instructions    Diet general   Complete by: As directed    Discharge instructions   Complete by: As directed    ?   Person Under Monitoring Name: Julie [name]  Location: [address] [place] [address]   Infection Prevention Recommendations for Individuals Confirmed to have, or Being Evaluated for, 2019 Novel Coronavirus (COVID-19) Infection Who Receive Care at Home  Individuals who are confirmed to have, or are being evaluated for, COVID-19 should follow the prevention steps below until a healthcare provider or local or state health department says they can return to normal activities.  Stay home except to get medical care You should restrict activities outside your home, except for getting medical care. Do not go to work, school, or public areas, and do not use public transportation or taxis.  Call ahead before visiting your doctor Before your medical appointment, call the healthcare provider and tell them that you have, or are being evaluated for, COVID-19 infection. This will help the healthcare provider's office take steps to keep other people from getting infected. Ask your healthcare provider to call the local or state health department.  Monitor your symptoms Seek prompt medical attention if your illness is worsening (e.g., difficulty breathing). Before going to your medical appointment, call the healthcare provider and tell them that you have, or are being evaluated for, COVID-19 infection. Ask your healthcare provider to call the local or state health department.  Wear a facemask You should wear a facemask that covers your nose and mouth when you are in the same room with other people and when you visit a healthcare provider. People who live with or visit you should also wear a facemask while they are in the same room with you.  Separate yourself from other people in your home As much as possible, you should stay in a different room from other people in your home. Also, you should use a separate bathroom, if available.  Avoid sharing household items You should not share dishes, drinking glasses, cups, eating  utensils, towels, bedding, or other items with other people in your home. After using these items, you should wash them thoroughly with soap and water.  Cover your coughs and sneezes Cover your mouth and nose with a tissue when you cough or sneeze, or you can cough or sneeze into your sleeve. Throw used tissues in a lined trash can, and immediately wash your hands with soap and water for at least 20 seconds or use an alcohol-based hand rub.  Wash your [employer] your hands often and thoroughly with soap and water for at least 20 seconds. You can use an alcohol-based hand sanitizer if soap and water are not available and if your hands are not visibly dirty. Avoid touching your eyes, nose, and mouth with unwashed hands.   Prevention Steps for Caregivers and Household Members of Individuals Confirmed to have, or Being Evaluated for, COVID-19 Infection Being Cared for in the Home  If you live with, or provide care at home for, a person confirmed to have, or being evaluated for, COVID-19 infection please follow these guidelines to prevent infection:  Follow healthcare provider's instructions Make sure that you understand and can help the patient follow any healthcare provider instructions for all care.  Provide for the patient's basic needs You should help the patient with basic needs in the home and provide support for getting groceries, prescriptions, and other personal needs.  Monitor the patient's symptoms If they are getting sicker, call his or her medical provider and tell them that the patient has, or is being evaluated for, COVID-19 infection. This will help the healthcare provider's office take steps to keep other people from getting infected. Ask the healthcare provider to call the local or state health department.  Limit the number of people who have contact with the patient If possible, have only one caregiver for the patient. Other household members should stay in another  home or place of residence. If this is not possible, they should stay in another room, or be separated from the patient as much as possible. Use a separate bathroom, if available. Restrict visitors who do not have an essential need to be in the home.  Keep older adults, very young children, and other sick people away from the patient Keep older adults, very young children, and those who have compromised immune systems or chronic health conditions away from the patient. This includes people with chronic heart, lung, or kidney conditions, diabetes, and cancer.  Ensure good ventilation Make sure that shared spaces in the home have good air flow, such as from an air conditioner or an opened window, weather permitting.  Wash your hands often Wash your hands often and thoroughly with soap and water for at least 20 seconds. You can use an alcohol based hand sanitizer if soap and water are not available and if your hands are not visibly dirty. Avoid touching your eyes, nose, and mouth with unwashed hands. Use disposable paper towels to dry your hands. If not available, use dedicated cloth towels and replace them when they become wet.  Wear a facemask and gloves Wear a disposable facemask at all times in the room and gloves when you touch or have contact with the patient's blood, body fluids, and/or secretions or excretions, such as sweat, saliva, sputum, nasal mucus, vomit, urine, or feces.  Ensure the mask fits over your nose and mouth tightly, and do not touch it during use. Throw out disposable facemasks and gloves after using them. Do not reuse. Wash your hands immediately after removing your facemask and gloves. If your personal clothing becomes contaminated, carefully remove clothing and launder. Wash your hands after handling contaminated clothing. Place all used disposable facemasks, gloves, and other waste in a lined container before disposing them with other household waste. Remove gloves and  wash your hands immediately after handling these items.  Do not share dishes, glasses, or other household items with the patient Avoid sharing household items. You should not share dishes, drinking glasses, cups, eating utensils, towels, bedding, or other items with a patient who is confirmed to have, or being evaluated for, COVID-19 infection. After the person uses these items, you should wash them thoroughly with soap and water.  Wash laundry thoroughly Immediately remove and wash clothes or bedding that have blood, body fluids, and/or secretions or excretions, such as sweat, saliva, sputum, nasal mucus, vomit, urine, or feces, on them. Wear gloves when handling laundry from the patient. Read and follow directions on labels of laundry or clothing items and detergent. In general, wash and dry with the warmest temperatures recommended on the label.  Clean all areas the individual has used often Clean all touchable surfaces, such as counters, tabletops, doorknobs, bathroom fixtures, toilets, phones, keyboards, tablets,  and bedside tables, every day. Also, clean any surfaces that may have blood, body fluids, and/or secretions or excretions on them. Wear gloves when cleaning surfaces the patient has come in contact with. Use a diluted bleach solution (e.g., dilute bleach with 1 part bleach and 10 parts water) or a household disinfectant with a label that says EPA-registered for coronaviruses. To make a bleach solution at home, add 1 tablespoon of bleach to 1 quart (4 cups) of water. For a larger supply, add  cup of bleach to 1 gallon (16 cups) of water. Read labels of cleaning products and follow recommendations provided on product labels. Labels contain instructions for safe and effective use of the cleaning product including precautions you should take when applying the product, such as wearing gloves or eye protection and making sure you have good ventilation during use of the product. Remove gloves  and wash hands immediately after cleaning.  Monitor yourself for signs and symptoms of illness Caregivers and household members are considered close contacts, should monitor their health, and will be asked to limit movement outside of the home to the extent possible. Follow the monitoring steps for close contacts listed on the symptom monitoring form.   ? If you have additional questions, contact your local health department or call the epidemiologist on call at [phone] (available 24/7). ? This guidance is subject to change. For the most up-to-date guidance from [facility], please refer to their website: [employer]Met[employer].hu   Increase activity slowly   Complete by: As directed    MyChart COVID-19 home monitoring program   Complete by: Nov 11, 2019    Is the patient willing to use the MyChart Mobile App for home monitoring?: Yes   Temperature monitoring   Complete by: Nov 11, 2019    After how many days would you like to receive a notification of this patient's flowsheet entries?: 1     Allergies as of 11/11/2019      Reactions   Penicillins Hives   Did it involve swelling of the face/tongue/throat, SOB, or low BP? Yes Did it involve sudden or severe rash/hives, skin peeling, or any reaction on the inside of your mouth or nose? No Did you need to seek medical attention at a hospital or doctor's office? No When did it last happen?unknown  If all above answers are "NO", may proceed with cephalosporin use.   Vancomycin Hives   Amoxicillin Rash      Medication List    STOP taking these medications   docusate sodium 100 MG capsule Commonly known as: Colace   senna 8.6 MG Tabs tablet Commonly known as: SENOKOT     TAKE these medications   acetaminophen 500 MG tablet Commonly known as: TYLENOL Take 1,000 mg by mouth every 6 (six) hours as needed for mild pain, moderate pain or headache.   albuterol 108 (90 Base) MCG/ACT  inhaler Commonly known as: VENTOLIN HFA Inhale 2 puffs into the lungs every 6 (six) hours as needed for wheezing or shortness of breath. What changed: how much to take   ascorbic acid 500 MG tablet Commonly known as: VITAMIN C Take 1 tablet (500 mg total) by mouth daily. Start taking on: November 12, 2019   benzonatate 200 MG capsule Commonly known as: TESSALON Take 1 capsule (200 mg total) by mouth 3 (three) times daily.   dexamethasone 6 MG tablet Commonly known as: DECADRON Take 1 tablet (6 mg total) by mouth daily for 5 days. Start taking on: November 12, 2019   famotidine 20 MG tablet Commonly known as: PEPCID Take 1 tablet (20 mg total) by mouth daily. Start taking on: November 12, 2019   HYDROcodone-homatropine 5-1.5 MG/5ML syrup Commonly known as: Hycodan Take 5 mLs by mouth every 6 (six) hours as needed for cough.   Ipratropium-Albuterol 20-100 MCG/ACT Aers respimat Commonly known as: COMBIVENT Inhale 2 puffs into the lungs 3 (three) times daily for 5 days.   levonorgestrel 20 MCG/24HR IUD Commonly known as: MIRENA 1 each by Intrauterine route once.   mometasone-formoterol 100-5 MCG/ACT Aero Commonly known as: DULERA Inhale 2 puffs into the lungs 2 (two) times daily. Use for 5 days then stop.   zinc sulfate 220 (50 Zn) MG capsule Take 1 capsule (220 mg total) by mouth daily. Start taking on: November 12, 2019      Allergies  Allergen Reactions  . Penicillins Hives    Did it involve swelling of the face/tongue/throat, SOB, or low BP? Yes Did it involve sudden or severe rash/hives, skin peeling, or any reaction on the inside of your mouth or nose? No Did you need to seek medical attention at a hospital or doctor's office? No When did it last happen?unknown  If all above answers are "NO", may proceed with cephalosporin use.   . Vancomycin Hives  . Amoxicillin Rash   Follow-up Information    Reita Cliche, MD. Schedule an appointment as soon as possible for a  visit in 3 week(s).   Specialty: Internal Medicine           The results of significant diagnostics from this hospitalization (including imaging, microbiology, ancillary and laboratory) are listed below for reference.    Significant Diagnostic Studies: CT Angio Chest PE W/Cm &/Or Wo Cm  Result Date: 11/07/2019 CLINICAL DATA:  PE suspected. Fever and shortness of breath. Recent COVID diagnosis. EXAM: CT ANGIOGRAPHY CHEST WITH CONTRAST TECHNIQUE: Multidetector CT imaging of the chest was performed using the standard protocol during bolus administration of intravenous contrast. Multiplanar CT image reconstructions and MIPs were obtained to evaluate the vascular anatomy. CONTRAST:  174mL OMNIPAQUE IOHEXOL 350 MG/ML SOLN COMPARISON:  None. FINDINGS: Cardiovascular: Contrast injection is sufficient to demonstrate satisfactory opacification of the pulmonary arteries to the segmental level. There is no pulmonary embolus. The main pulmonary artery is within normal limits for size. There is no CT evidence of acute right heart strain. The visualized aorta is normal. Heart size is normal, without pericardial effusion. Mediastinum/Nodes: --No mediastinal or hilar lymphadenopathy. --No axillary lymphadenopathy. --No supraclavicular lymphadenopathy. --Normal thyroid gland. --The esophagus is unremarkable Lungs/Pleura: There are ground-glass airspace opacities in the lower lobes bilaterally, left worse than right. There is no pneumothorax or significant pleural effusion. The trachea is unremarkable. Upper Abdomen: No acute abnormality. Musculoskeletal: No chest wall abnormality. No acute or significant osseous findings. Review of the MIP images confirms the above findings. IMPRESSION: 1. No evidence of pulmonary embolus. 2. Bilateral ground-glass airspace opacities in the lower lobes, left worse than right, which are concerning for an atypical infectious process in the appropriate clinical setting. Electronically  Signed   By: Constance Holster M.D.   On: 11/07/2019 16:52   DG Chest Portable 1 View  Result Date: 11/07/2019 CLINICAL DATA:  Positive COVID-19 test 5 days ago, worsening shortness of breath and cough, previous tobacco abuse EXAM: PORTABLE CHEST 1 VIEW COMPARISON:  None. FINDINGS: The heart size and mediastinal contours are within normal limits. Both lungs are clear. The visualized skeletal structures are unremarkable. IMPRESSION: No  active disease. Electronically Signed   By: Sharlet SalinaMichael  Brown M.D.   On: 11/07/2019 15:11    Microbiology: Recent Results (from the past 240 hour(s))  Blood Culture (routine x 2)     Status: None (Preliminary result)   Collection Time: 11/07/19  3:17 PM   Specimen: BLOOD  Result Value Ref Range Status   Specimen Description BLOOD RIGHT ANTECUBITAL  Final   Special Requests   Final    BOTTLES DRAWN AEROBIC AND ANAEROBIC Blood Culture adequate volume Performed at [facility] [place], [address] [place], [place] [address]    Culture   Final    NO GROWTH 4 DAYS Performed at [facility], [address] [place], [place] [address]    Report Status PENDING  Incomplete  Blood Culture (routine x 2)     Status: None (Preliminary result)   Collection Time: 11/07/19  3:28 PM   Specimen: BLOOD LEFT ARM  Result Value Ref Range Status   Specimen Description BLOOD LEFT ARM  Final   Special Requests   Final    BOTTLES DRAWN AEROBIC AND ANAEROBIC Blood Culture results may not be optimal due to an inadequate volume of blood received in culture bottles Performed at [facility] [place], [address] [place], [place] [address]    Culture   Final    NO GROWTH 4 DAYS Performed at [facility], [address] [place], [place] [address]    Report Status PENDING  Incomplete  GI pathogen panel by PCR, stool     Status: None   Collection Time: 11/08/19  4:32 PM   Specimen: Stool  Result Value Ref Range Status   Plesiomonas shigelloides NOT  DETECTED NOT DETECTED Final   Yersinia enterocolitica NOT DETECTED NOT DETECTED Final   Vibrio NOT DETECTED NOT DETECTED Final   Enteropathogenic E coli NOT DETECTED NOT DETECTED Final   E coli (ETEC) LT/ST NOT DETECTED NOT DETECTED Final   E coli 0157 by PCR Not applicable NOT DETECTED Final   Cryptosporidium by PCR NOT DETECTED NOT DETECTED Final   Entamoeba histolytica NOT DETECTED NOT DETECTED Final   Adenovirus F 40/41 NOT DETECTED NOT DETECTED Final   Norovirus GI/GII NOT DETECTED NOT DETECTED Final   Sapovirus NOT DETECTED NOT DETECTED Final    Comment: (NOTE) Performed At: [facility] [address] [place], [place] [id number] Jolene SchimkeNagendra Sanjai MD VQ:[id number]:[phone]    Vibrio cholerae NOT DETECTED NOT DETECTED Final   Campylobacter by PCR NOT DETECTED NOT DETECTED Final   Salmonella by PCR NOT DETECTED NOT DETECTED Final   E coli (STEC) NOT DETECTED NOT DETECTED Final   Enteroaggregative E coli NOT DETECTED NOT DETECTED Final   Shigella by PCR NOT DETECTED NOT DETECTED Final   Cyclospora cayetanensis NOT DETECTED NOT DETECTED Final   Astrovirus NOT DETECTED NOT DETECTED Final   G lamblia by PCR NOT DETECTED NOT DETECTED Final   Rotavirus A by PCR NOT DETECTED NOT DETECTED Final     Labs: Basic Metabolic Panel: Recent Labs  Lab 11/07/19 1517 11/07/19 2115 11/08/19 0434 11/09/19 0426 11/10/19 0342 11/11/19 0333  NA 137  --  139 137 136 135  K 3.3*  --  4.1 4.2 4.6 4.4  CL 101  --  107 105 104 104  CO2 24  --  22 22 24 24   GLUCOSE 100*  --  112* 105* 125* 151*  BUN 9  --  14 14 12  13  CREATININE 0.71  --  0.70 0.68 0.57 0.67  CALCIUM 8.7*  --  9.0 8.9 8.7* 8.5*  MG  --  2.3  --   --   --   --    Liver Function Tests: Recent Labs  Lab 11/07/19 1517 11/08/19 0434 11/09/19 0426 11/10/19 0342 11/11/19 0333  AST 20 21 21 24 18   ALT 15 16 18 27 23   ALKPHOS 59 56 51 51 51  BILITOT 0.5 0.5 0.5 0.3 0.3  PROT 7.2 6.7 6.1* 6.3* 6.0*  ALBUMIN 4.1 3.7 3.6 3.4*  3.4*   No results for input(s): LIPASE, AMYLASE in the last 168 hours. No results for input(s): AMMONIA in the last 168 hours. CBC: Recent Labs  Lab 11/07/19 1517 11/08/19 0434 11/09/19 0426 11/10/19 0342 11/11/19 0333  WBC 4.3 2.9* 5.4 5.3 6.2  NEUTROABS 3.1 2.2 3.7 4.0 5.2  HGB 14.2 13.4 13.3 13.2 12.5  HCT 43.9 41.7 41.4 41.9 39.6  MCV 88.9 88.2 89.6 89.9 89.4  PLT 225 218 215 263 254   Cardiac Enzymes: No results for input(s): CKTOTAL, CKMB, CKMBINDEX, TROPONINI in the last 168 hours. BNP: BNP (last 3 results) No results for input(s): BNP in the last 8760 hours.  ProBNP (last 3 results) No results for input(s): PROBNP in the last 8760 hours.  CBG: No results for input(s): GLUCAP in the last 168 hours.     Signed:  11/12/19 MD.  [facility] 11/11/2019, 2:53 PM

## 2019-11-12 LAB — CULTURE, BLOOD (ROUTINE X 2)
Culture: NO GROWTH
Culture: NO GROWTH
Special Requests: ADEQUATE

## 2021-12-21 IMAGING — CT CT ANGIO CHEST
3 of 10 series · 17 of 36 positions shown · IV contrast (Omnipaque)
Comparison: None.

CLINICAL DATA: PE suspected. Fever and shortness of breath. Recent
COVID diagnosis.

EXAM:
CT ANGIOGRAPHY CHEST WITH CONTRAST
TECHNIQUE: Multidetector CT imaging of the chest was performed using the
standard protocol during bolus administration of intravenous
contrast. Multiplanar CT image reconstructions and MIPs were
obtained to evaluate the vascular anatomy.
CONTRAST:  100mL OMNIPAQUE IOHEXOL 350 MG/ML SOLN

[Series 6: pe thins · axial · 0.71mm/px · z∈[-58,+182]mm · 14 of 279 slices shown]
[im 19/279  lung]
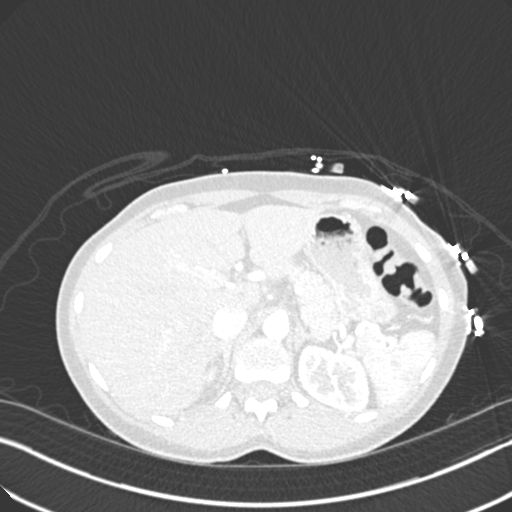
[im 38/279  mediastinal]
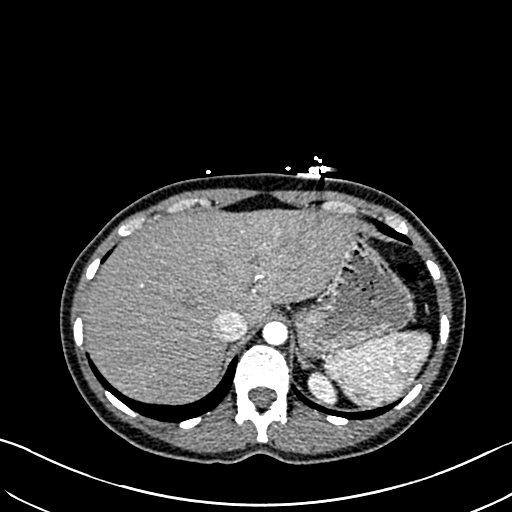
[im 56/279  lung]
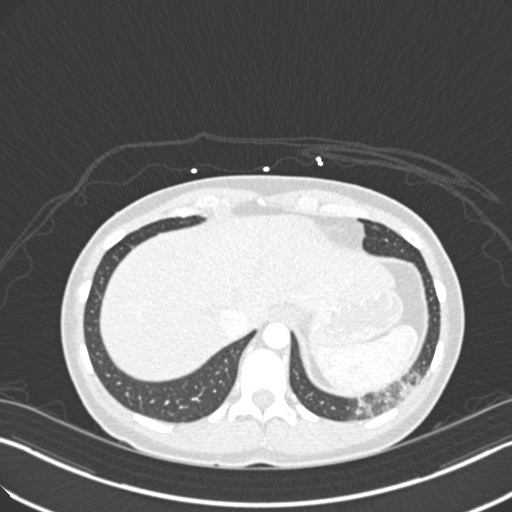
[im 75/279  mediastinal]
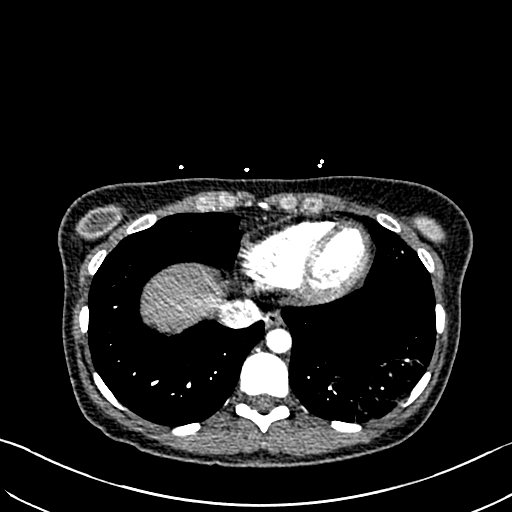
[im 93/279  lung]
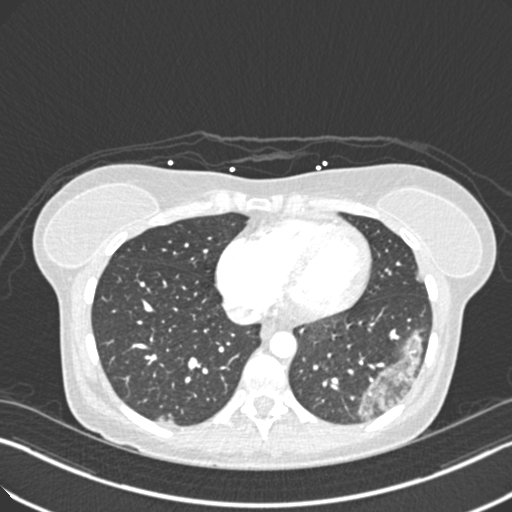
[im 112/279  mediastinal]
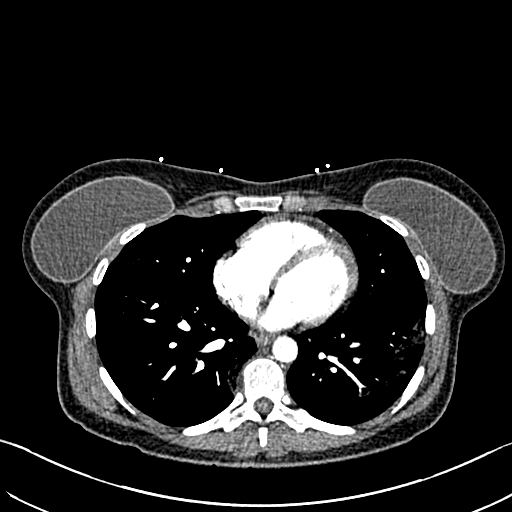
[im 130/279  lung]
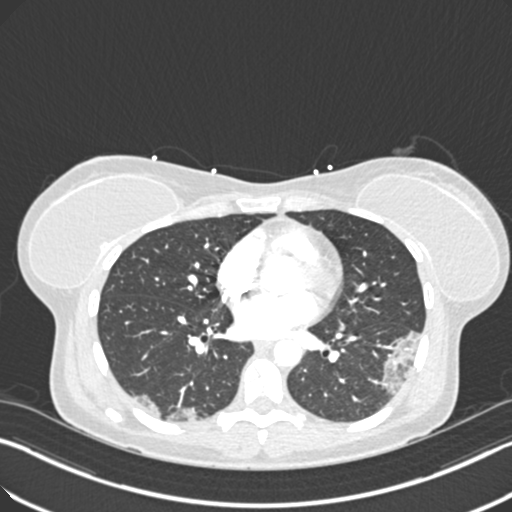
[im 149/279  mediastinal]
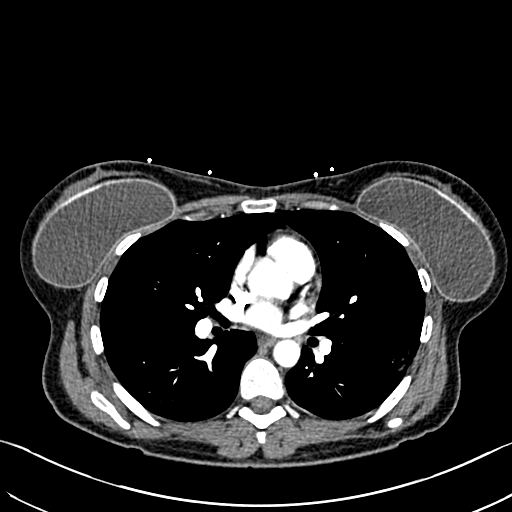
[im 167/279  lung]
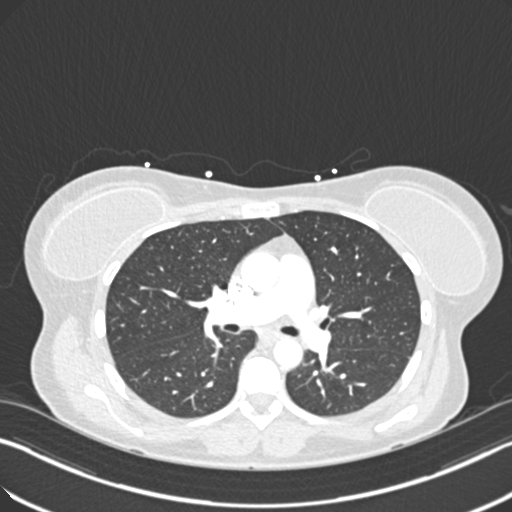
[im 186/279  mediastinal]
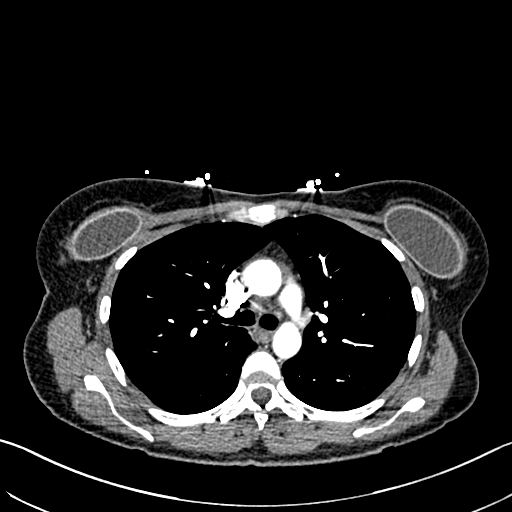
[im 204/279  lung]
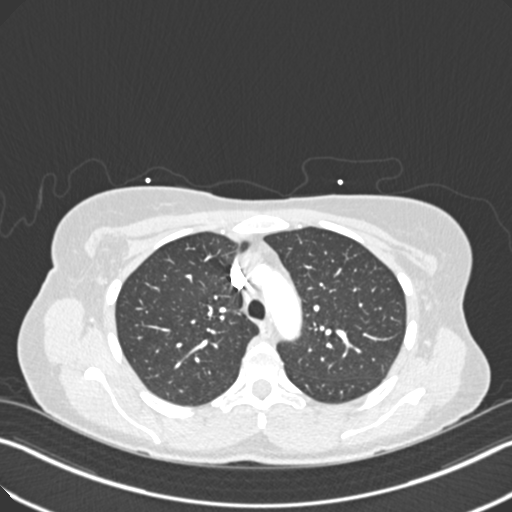
[im 223/279  mediastinal]
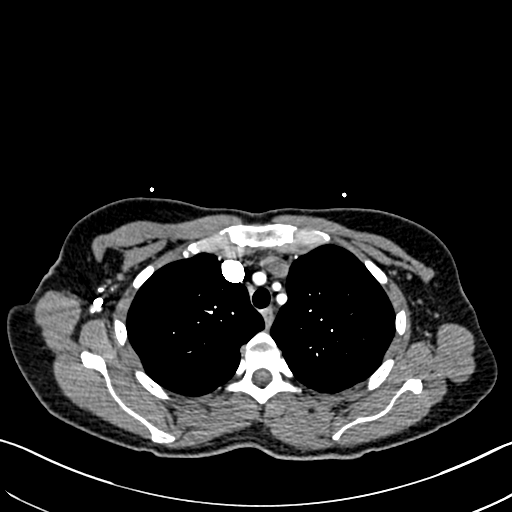
[im 241/279  lung]
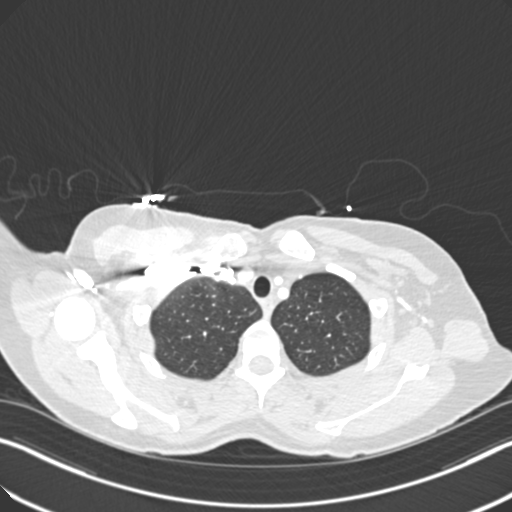
[im 260/279  mediastinal]
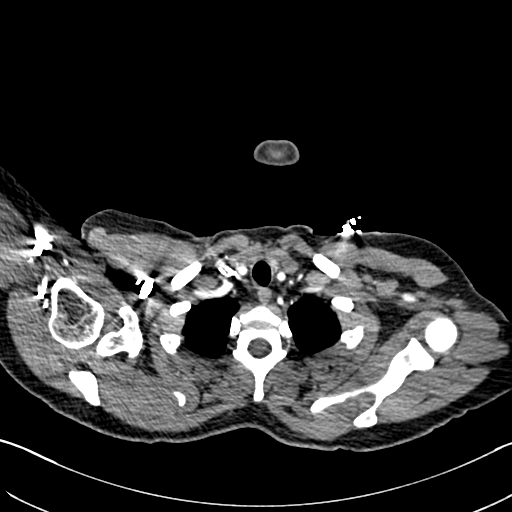

[Series 7: pe lung · axial · 0.98mm/px · z∈[+34,+118]mm · 2 of 84 slices shown]
[im 28/84  mediastinal]
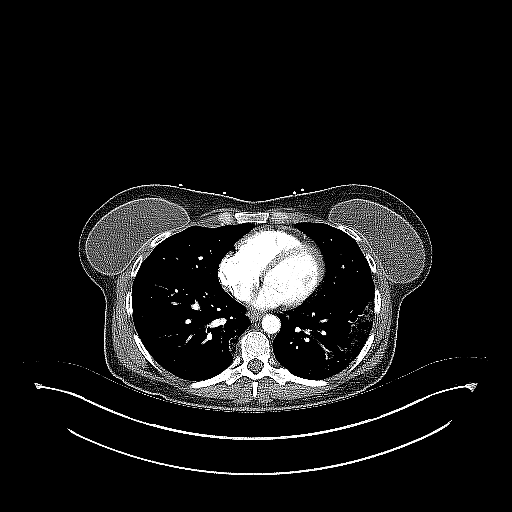
[im 56/84  mediastinal]
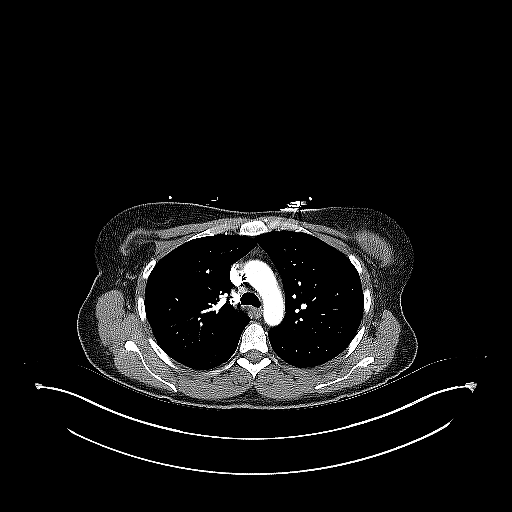

[Series 8: pe coronal mpr · coronal · 0.59mm/px · 1 of 105 slices shown]
[im 53/105  mediastinal]
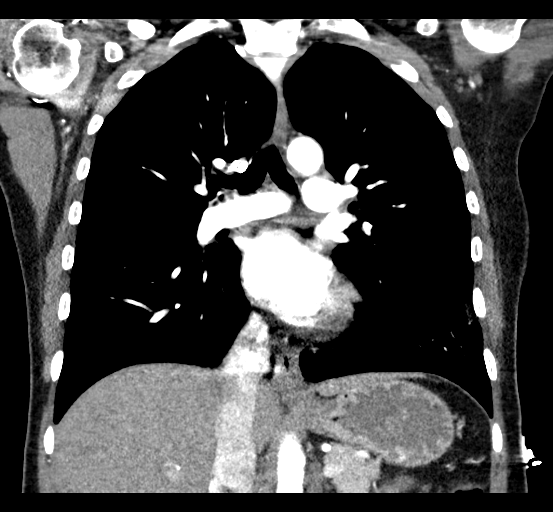

[17 of 36 positions shown; findings below may reference images not displayed]

FINDINGS: Cardiovascular: Contrast injection is sufficient to demonstrate
satisfactory opacification of the pulmonary arteries to the
segmental level. There is no pulmonary embolus. The main pulmonary
artery is within normal limits for size. There is no CT evidence of
acute right heart strain. The visualized aorta is normal. Heart size
is normal, without pericardial effusion.

Mediastinum/Nodes:

--No mediastinal or hilar lymphadenopathy.

--No axillary lymphadenopathy.

--No supraclavicular lymphadenopathy.

--Normal thyroid gland.

--The esophagus is unremarkable

Lungs/Pleura: There are ground-glass airspace opacities in the lower
lobes bilaterally, left worse than right. There is no pneumothorax
or significant pleural effusion. The trachea is unremarkable.

Upper Abdomen: No acute abnormality.

Musculoskeletal: No chest wall abnormality. No acute or significant
osseous findings.

Review of the MIP images confirms the above findings.
IMPRESSION: 1. No evidence of pulmonary embolus.
2. Bilateral ground-glass airspace opacities in the lower lobes,
left worse than right, which are concerning for an atypical
infectious process in the appropriate clinical setting.
